# Patient Record
Sex: Male | Born: 1965 | Race: Black or African American | Hispanic: No | Marital: Married | Smoking: Never smoker
Health system: Southern US, Community
[De-identification: ages and names within clinical notes are randomized; demographics above are authoritative.]

## PROBLEM LIST (undated history)

## (undated) DIAGNOSIS — C189 Malignant neoplasm of colon, unspecified: Secondary | ICD-10-CM

## (undated) DIAGNOSIS — G4730 Sleep apnea, unspecified: Secondary | ICD-10-CM

## (undated) DIAGNOSIS — E785 Hyperlipidemia, unspecified: Secondary | ICD-10-CM

## (undated) DIAGNOSIS — Z972 Presence of dental prosthetic device (complete) (partial): Secondary | ICD-10-CM

## (undated) DIAGNOSIS — I10 Essential (primary) hypertension: Secondary | ICD-10-CM

## (undated) DIAGNOSIS — N529 Male erectile dysfunction, unspecified: Secondary | ICD-10-CM

## (undated) HISTORY — PX: ABDOMINAL HERNIA REPAIR: SHX539

## (undated) HISTORY — DX: Malignant neoplasm of colon, unspecified: C18.9

## (undated) HISTORY — DX: Male erectile dysfunction, unspecified: N52.9

## (undated) HISTORY — DX: Sleep apnea, unspecified: G47.30

## (undated) HISTORY — DX: Hyperlipidemia, unspecified: E78.5

---

## 2006-04-11 ENCOUNTER — Ambulatory Visit: Payer: Self-pay | Admitting: Otolaryngology

## 2011-08-19 DIAGNOSIS — C189 Malignant neoplasm of colon, unspecified: Secondary | ICD-10-CM

## 2011-08-19 HISTORY — DX: Malignant neoplasm of colon, unspecified: C18.9

## 2012-02-16 HISTORY — PX: PARTIAL COLECTOMY: SHX5273

## 2012-02-26 ENCOUNTER — Ambulatory Visit: Payer: Self-pay | Admitting: Emergency Medicine

## 2012-02-26 LAB — CBC WITH DIFFERENTIAL/PLATELET
Basophil #: 0 10*3/uL (ref 0.0–0.1)
Eosinophil %: 5 %
HCT: 42.6 % (ref 40.0–52.0)
HGB: 14.1 g/dL (ref 13.0–18.0)
Lymphocyte #: 1.3 10*3/uL (ref 1.0–3.6)
Lymphocyte %: 27.7 %
Monocyte %: 11 %
Neutrophil %: 55.6 %
RDW: 13.6 % (ref 11.5–14.5)
WBC: 4.7 10*3/uL (ref 3.8–10.6)

## 2012-03-11 ENCOUNTER — Inpatient Hospital Stay: Payer: Self-pay | Admitting: Emergency Medicine

## 2012-03-12 LAB — CBC WITH DIFFERENTIAL/PLATELET
Basophil %: 0.2 %
Eosinophil %: 0.1 %
MCH: 27.7 pg (ref 26.0–34.0)
MCHC: 32.8 g/dL (ref 32.0–36.0)
MCV: 85 fL (ref 80–100)
Platelet: 236 10*3/uL (ref 150–440)
RDW: 13.4 % (ref 11.5–14.5)

## 2012-03-13 LAB — CBC WITH DIFFERENTIAL/PLATELET
Basophil #: 0 10*3/uL (ref 0.0–0.1)
Eosinophil #: 0 10*3/uL (ref 0.0–0.7)
HGB: 10.3 g/dL — ABNORMAL LOW (ref 13.0–18.0)
Lymphocyte %: 22.7 %
MCHC: 33.8 g/dL (ref 32.0–36.0)
Neutrophil %: 66.6 %
Platelet: 260 10*3/uL (ref 150–440)
RDW: 13.4 % (ref 11.5–14.5)

## 2012-03-15 LAB — HEMOGLOBIN: HGB: 9 g/dL — ABNORMAL LOW (ref 13.0–18.0)

## 2012-03-29 ENCOUNTER — Ambulatory Visit: Payer: Self-pay | Admitting: Oncology

## 2012-04-18 ENCOUNTER — Ambulatory Visit: Payer: Self-pay | Admitting: Oncology

## 2012-09-20 ENCOUNTER — Telehealth: Payer: Self-pay | Admitting: Genetic Counselor

## 2012-09-20 NOTE — Telephone Encounter (Signed)
PT called to schedule a genetic appt 03/24 @ 10 w/Karen Lowell Guitar.  Welcome packet mailed.

## 2012-10-16 ENCOUNTER — Ambulatory Visit: Payer: Self-pay | Admitting: Oncology

## 2012-11-08 ENCOUNTER — Other Ambulatory Visit: Payer: BC Managed Care – PPO | Admitting: Lab

## 2012-11-08 ENCOUNTER — Ambulatory Visit (HOSPITAL_BASED_OUTPATIENT_CLINIC_OR_DEPARTMENT_OTHER): Payer: BC Managed Care – PPO | Admitting: Genetic Counselor

## 2012-11-08 DIAGNOSIS — C189 Malignant neoplasm of colon, unspecified: Secondary | ICD-10-CM

## 2012-11-08 DIAGNOSIS — IMO0002 Reserved for concepts with insufficient information to code with codable children: Secondary | ICD-10-CM

## 2012-11-08 DIAGNOSIS — Z803 Family history of malignant neoplasm of breast: Secondary | ICD-10-CM

## 2012-11-08 DIAGNOSIS — Z800 Family history of malignant neoplasm of digestive organs: Secondary | ICD-10-CM

## 2012-11-09 ENCOUNTER — Encounter: Payer: Self-pay | Admitting: Genetic Counselor

## 2012-11-09 NOTE — Progress Notes (Signed)
Dr.  Gretta Began requested a consultation for genetic counseling and risk assessment for Patrick West, a 47 y.o. male, for discussion of his personal history of colon cancer and family history of colon and breast cancer. He presents to clinic today to discuss the possibility of a genetic predisposition to cancer, and to further clarify his risks, as well as his family members' risks for cancer.   HISTORY OF PRESENT ILLNESS: In 2013, at the age of 27, Patrick West was diagnosed with colon cancer. This was treated with surgery in July 2013.Marland Kitchen    Past Medical History  Diagnosis Date  . Colon cancer     History reviewed. No pertinent past surgical history.  History  Substance Use Topics  . Smoking status: Not on file  . Smokeless tobacco: Not on file  . Alcohol Use: Not on file    PERSONAL RISK ASSESSMENT FACTORS: Colonoscopy: colon polyps and colon cancer Non-smoker   FAMILY HISTORY:  We obtained a detailed, 4-generation family history.  Significant diagnoses are listed below: Family History  Problem Relation Age of Onset  . Breast cancer Sister 51  . Breast cancer Maternal Aunt     dx in her late 15s-30s  . Colon cancer Maternal Uncle     smoker  . Breast cancer Paternal Aunt     dx in her 84s  . Throat cancer Paternal Uncle     smoker  . Breast cancer Maternal Aunt     dx at age 47  . Colon cancer Maternal Aunt 29  . Breast cancer Paternal Aunt     dx around age 75  . Colon cancer Cousin     dx around age 60-17; paternal cousin  . Colon cancer Cousin 80    paternal cousin  The patient was diagnosed with colon cancer at age 22.  He has two sisters and one brother.  One sister was diagnosed with breast cancer at age 69.  She is undergoing genetic testing at [facility].  His parents are living at 69 and do not have a history of cancer.  His mother has two maternal half sisters, one full sister and five full brothers.  Her half sisters each had breast cancer, one  in her 66s-30s and the other at age 15.  Her full sister had colon cancer at age 52, and a brother had colon cancer at an unknown age.  The patient's father had 15 brothers and sisters.  Two sisters had breast cancer, one in her 31s and the other at age 33.  One brother who was a smoker had throat cancer.    Patient's maternal ancestors are of African American descent, and paternal ancestors are of African American descent. There is no reported Ashkenazi Jewish ancestry. There is no  known consanguinity.  GENETIC COUNSELING RISK ASSESSMENT, DISCUSSION, AND SUGGESTED FOLLOW UP: We reviewed the natural history and genetic etiology of sporadic, familial and hereditary cancer syndromes.  About 10% of colon cancer is hereditary.  Of this, about half is the result of either Lynch syndrome of Familial Adenomatous Polyposis (FAP).   About 5-10% of breast cancer is hereditary.  Of this, about 85% is the result of a BRCA1 or BRCA2 mutation.  We reviewed the red flags of hereditary cancer syndromes and the dominant inheritance patterns.  If the Lynch syndrome, FAP and BRCA testing is negative, we discussed that we could be testing for the wrong gene.  We discussed gene panels, and that several cancer genes  that are associated with different cancers can be tested at the same time.  Because of the different types of cancer that are in the patient's family, we will consider one of the panel tests.   The patient's personal history of colon cancer and family history of colon and breast cancer is suggestive of the following possible diagnosis: hereditary cancer syndrome  We discussed that identification of a hereditary cancer syndrome may help his care providers tailor the patients medical management. If a mutation indicating a hereditary cancer syndrome is detected in this case, the [employer] recommendations would include increased cancer surveillance and possible prophylactic surgery. If a  mutation is detected, the patient will be referred back to the referring provider and to any additional appropriate care providers to discuss the relevant options.   If a mutation is not found in the patient, this will decrease the likelihood of a hereditary cancer syndrome as the explanation for his colon cancer. Cancer surveillance options would be discussed for the patient according to the appropriate standard National Comprehensive Cancer Network and American Cancer Society guidelines, with consideration of their personal and family history risk factors. In this case, the patient will be referred back to their care providers for discussions of management.   In order to estimate his chance of having a MMR mutation, we used statistical models (Premm 1,2,6) and laboratory data that take into account his personal medical history, family history and ancestry.  Because each model is different, there can be a lot of variability in the risks they give.  Therefore, these numbers must be considered a rough range and not a precise risk of having a MMR mutation.  These models estimate that she has approximately a 33% chance of having a mutation. Based on this assessment of her family and personal history, genetic testing is recommended.  After considering the risks, benefits, and limitations, the patient provided informed consent for  the following  testing: Comprehensive Cancer Panel through [facility].   Per the patient's request, we will contact him by telephone to discuss these results. A follow up genetic counseling visit will be scheduled if indicated.  The patient was seen for a total of 60 minutes, greater than 50% of which was spent face-to-face counseling.  This plan is being carried out per Dr. Jovita Gamma recommendations.  This note will also be sent to the referring provider via the electronic medical record. The patient will be supplied with a summary of this genetic counseling discussion as well as  educational information on the discussed hereditary cancer syndromes following the conclusion of their visit.   Patient was discussed with Dr. Drue Second.   _______________________________________________________________________ For Office Staff:  Number of people involved in session: 1 Was an Intern/ student involved with case: yes }

## 2012-11-10 LAB — CBC CANCER CENTER
Basophil %: 0.6 %
Eosinophil %: 3.1 %
HCT: 45.8 % (ref 40.0–52.0)
HGB: 15.4 g/dL (ref 13.0–18.0)
Lymphocyte %: 28.3 %
MCHC: 33.6 g/dL (ref 32.0–36.0)
Monocyte #: 0.5 x10 3/mm (ref 0.2–1.0)
Neutrophil #: 3.3 x10 3/mm (ref 1.4–6.5)
Platelet: 243 x10 3/mm (ref 150–440)
WBC: 5.5 x10 3/mm (ref 3.8–10.6)

## 2012-11-12 LAB — CEA: CEA: 2.2 ng/mL (ref 0.0–4.7)

## 2012-11-16 ENCOUNTER — Ambulatory Visit: Payer: Self-pay | Admitting: Oncology

## 2012-12-07 ENCOUNTER — Telehealth: Payer: Self-pay | Admitting: Genetic Counselor

## 2012-12-07 NOTE — Telephone Encounter (Signed)
Left message that I was calling from [facility] and I had some test results.  I was told that he should be back around 4 PM.  I left my phone number for him to call back.

## 2012-12-16 ENCOUNTER — Telehealth: Payer: Self-pay | Admitting: Genetic Counselor

## 2012-12-16 NOTE — Telephone Encounter (Signed)
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  Patient does not want to pursue family studies.

## 2012-12-21 ENCOUNTER — Encounter: Payer: Self-pay | Admitting: Genetic Counselor

## 2014-03-11 ENCOUNTER — Emergency Department: Payer: Self-pay | Admitting: Emergency Medicine

## 2014-03-11 LAB — URINALYSIS, COMPLETE
Bacteria: NONE SEEN
Bilirubin,UR: NEGATIVE
GLUCOSE, UR: NEGATIVE mg/dL (ref 0–75)
Ketone: NEGATIVE
Leukocyte Esterase: NEGATIVE
Nitrite: NEGATIVE
PROTEIN: NEGATIVE
Ph: 6 (ref 4.5–8.0)
RBC,UR: <1 /HPF (ref 0–5)
SPECIFIC GRAVITY: 1.011 (ref 1.003–1.030)
SQUAMOUS EPITHELIAL: NONE SEEN
WBC UR: 2 /HPF (ref 0–5)

## 2014-03-11 LAB — COMPREHENSIVE METABOLIC PANEL
Albumin: 3.4 g/dL (ref 3.4–5.0)
Alkaline Phosphatase: 44 U/L — ABNORMAL LOW
Anion Gap: 6 — ABNORMAL LOW (ref 7–16)
BUN: 7 mg/dL (ref 7–18)
Bilirubin,Total: 0.5 mg/dL (ref 0.2–1.0)
CHLORIDE: 101 mmol/L (ref 98–107)
Calcium, Total: 8.7 mg/dL (ref 8.5–10.1)
Co2: 28 mmol/L (ref 21–32)
Creatinine: 1.06 mg/dL (ref 0.60–1.30)
EGFR (Non-African Amer.): >60
GLUCOSE: 91 mg/dL (ref 65–99)
Osmolality: 268 (ref 275–301)
POTASSIUM: 3.8 mmol/L (ref 3.5–5.1)
SGOT(AST): 33 U/L (ref 15–37)
SGPT (ALT): 39 U/L
Sodium: 135 mmol/L — ABNORMAL LOW (ref 136–145)
TOTAL PROTEIN: 8.3 g/dL — AB (ref 6.4–8.2)

## 2014-03-11 LAB — TROPONIN I

## 2014-03-11 LAB — CBC WITH DIFFERENTIAL/PLATELET
BASOS ABS: 0 10*3/uL (ref 0.0–0.1)
BASOS PCT: 0.5 %
Eosinophil #: 0.1 10*3/uL (ref 0.0–0.7)
Eosinophil %: 1.2 %
HCT: 45.4 % (ref 40.0–52.0)
HGB: 14.8 g/dL (ref 13.0–18.0)
Lymphocyte #: 1.5 10*3/uL (ref 1.0–3.6)
Lymphocyte %: 20.2 %
MCH: 28.5 pg (ref 26.0–34.0)
MCHC: 32.6 g/dL (ref 32.0–36.0)
MCV: 87 fL (ref 80–100)
MONO ABS: 0.9 x10 3/mm (ref 0.2–1.0)
Monocyte %: 11.5 %
NEUTROS ABS: 5.1 10*3/uL (ref 1.4–6.5)
NEUTROS PCT: 66.6 %
Platelet: 270 10*3/uL (ref 150–440)
RBC: 5.19 10*6/uL (ref 4.40–5.90)
RDW: 13 % (ref 11.5–14.5)
WBC: 7.7 10*3/uL (ref 3.8–10.6)

## 2014-03-11 LAB — LIPASE, BLOOD: LIPASE: 89 U/L (ref 73–393)

## 2014-04-17 ENCOUNTER — Ambulatory Visit: Payer: Self-pay | Admitting: Gastroenterology

## 2014-04-19 LAB — PATHOLOGY REPORT

## 2014-05-16 ENCOUNTER — Telehealth: Payer: Self-pay | Admitting: Genetic Counselor

## 2014-05-16 NOTE — Telephone Encounter (Signed)
LM on VM asking that he call back in regards to updated information from his visit in March.

## 2014-05-22 ENCOUNTER — Telehealth: Payer: Self-pay | Admitting: Genetic Counselor

## 2014-05-22 NOTE — Telephone Encounter (Signed)
LM on VM that we had updated information on his testing performed last fall and to please call back.

## 2014-05-26 ENCOUNTER — Telehealth: Payer: Self-pay | Admitting: Genetic Counselor

## 2014-05-26 NOTE — Telephone Encounter (Signed)
LM on VM.  Left a generic message as there is not a voice mail message.  Left CB number.

## 2014-12-05 NOTE — Op Note (Signed)
PATIENT NAME:  Patrick, West MR#:  [id number] DATE OF BIRTH:  [birth year]  DATE OF PROCEDURE:  03/11/2012  PREOPERATIVE DIAGNOSIS: Tumor in the sigmoid colon.   POSTOPERATIVE DIAGNOSIS: Tumor in the sigmoid colon.   PROCEDURE: Sigmoid colon resection.  SURGEON:  Abhay Godbolt S. Raudel Bazen, MD  INDICATIONS: This patient was seen by me in consultation because patient has a very large polyp in the sigmoid colon which could not be removed with a sigmoidoscopy and the rest of the colon was normal.   DESCRIPTION OF PROCEDURE: Patient was then brought to surgery and I explained to them all the surgical complications including leakage, bleeding, reoperation, etc. Patient was then brought to surgery and preoperatively Invanz was given and also Foley catheter was inserted.   Under general anesthesia, the abdomen was then prepped and draped. Low midline incision was made. After cutting skin and subcutaneous tissue the fascia was cut. The abdomen was then entered. After entering the abdomen, the small bowel was then packed upwards to the left upper quadrant as well as right upper quadrant. The tumor which was in the sigmoid colon I could feel it. It also had a lot of [place] ink on it. Dissection was done over the line of Toldt to bring the sigmoid colon up into the wound. After bringing up quite a bit of sigmoid colon we dissected the proximal as well as distal portion with the GIA. The omentum was then clamped and cut with 0 Vicryl ties and then suture was then performed with GIA and TA-60 and reinforcement of suture line was then performed with of 4-0 silk sutures. In the surgery Dr. Burt Knack also helped me. The wound was then cleaned out very nicely, washed out. The abdomen was then washed out and made sure there was no bleeding in the operative site and after this was made sure, made sure all the sponge count was correct then the abdomen was then closed peritoneum with running Vicryl,  fascia with interrupted Vicryl,  skin with skin clips.       Patient tolerated procedure well, sent to recovery room in satisfactory condition.   ____________________________ Welford Roche Phylis Bougie, MD msh:cms D: 03/11/2012 11:34:35 ET T: 03/11/2012 12:36:32 ET JOB#: [id number]  cc: Lizzy Hamre S. Phylis Bougie, MD, <Dictator> Jill Side, MD Sharene Butters MD ELECTRONICALLY SIGNED 03/16/2012 11:50

## 2016-03-07 IMAGING — CT CT STONE STUDY
2 of 4 series · 16 of 46 positions shown, 18 images · non-contrast
Comparison: None.

CLINICAL DATA: Abdomen and flank pain; microscopic hematuria

EXAM:
CT ABDOMEN AND PELVIS WITHOUT CONTRAST
TECHNIQUE: Multidetector CT imaging of the abdomen and pelvis was performed
following the standard protocol without oral or intravenous contrast
material administration.

[Series 2: stone standard full · axial · 0.77mm/px · z∈[-1155,-695]mm · 13 of 102 slices shown, 15 images]
[im 5/102  soft-tissue]
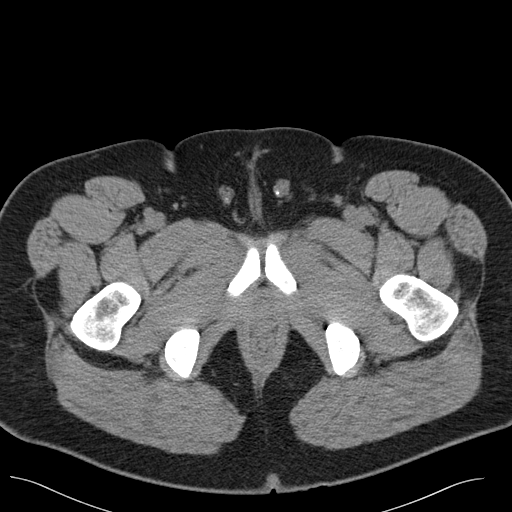
[im 5/102  bone]
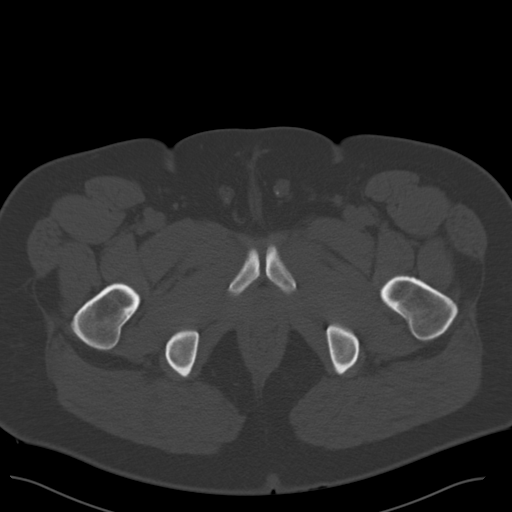
[im 13/102  soft-tissue]
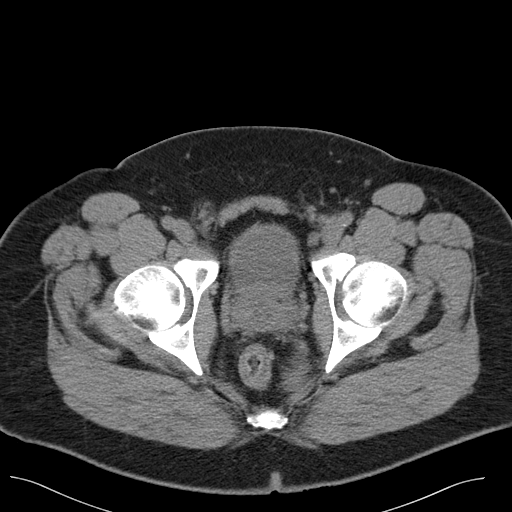
[im 22/102  soft-tissue]
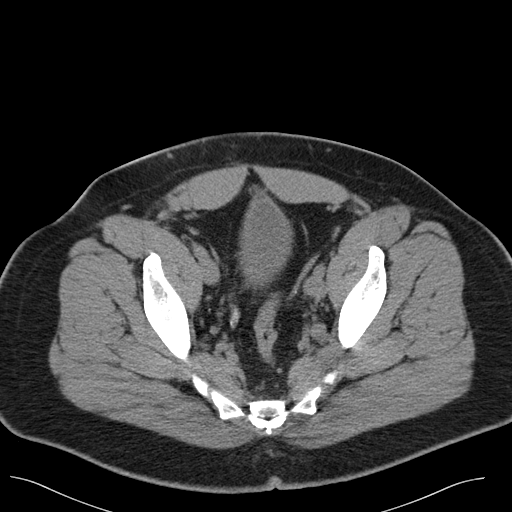
[im 30/102  soft-tissue]
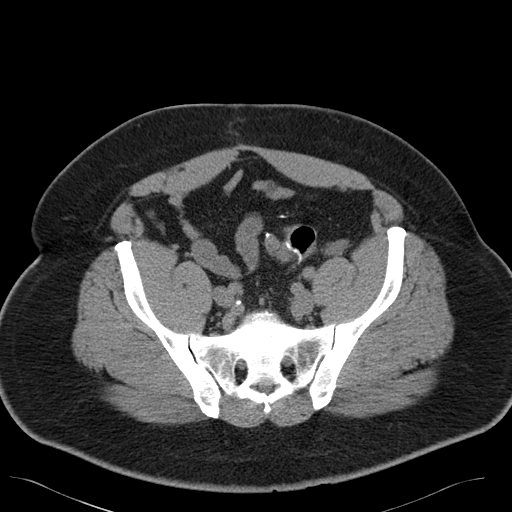
[im 34/102  soft-tissue]
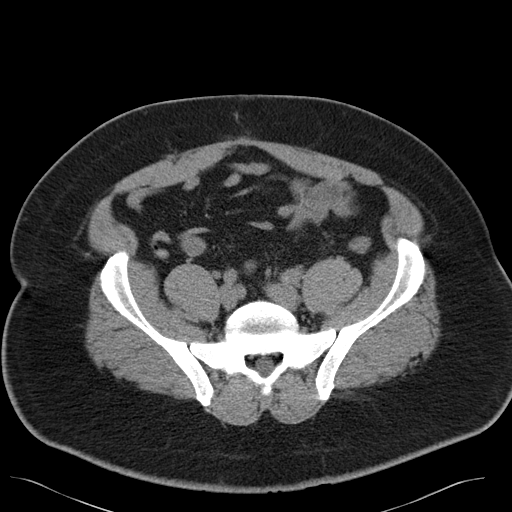
[im 43/102  soft-tissue]
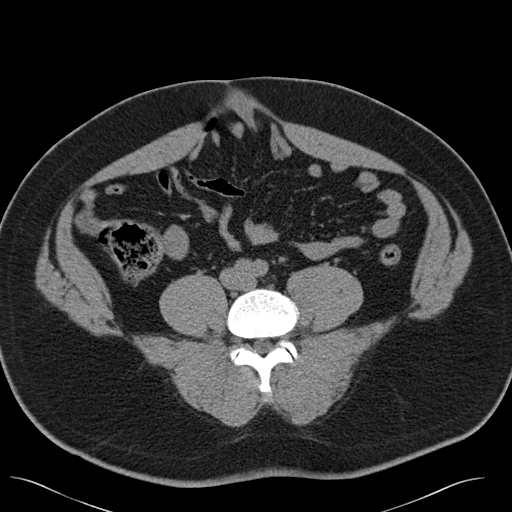
[im 51/102  soft-tissue]
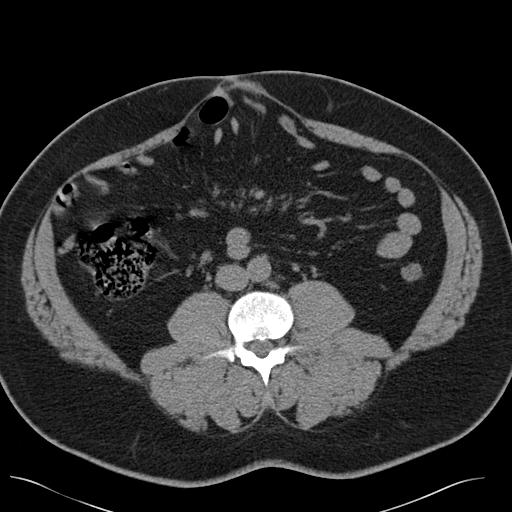
[im 59/102  soft-tissue]
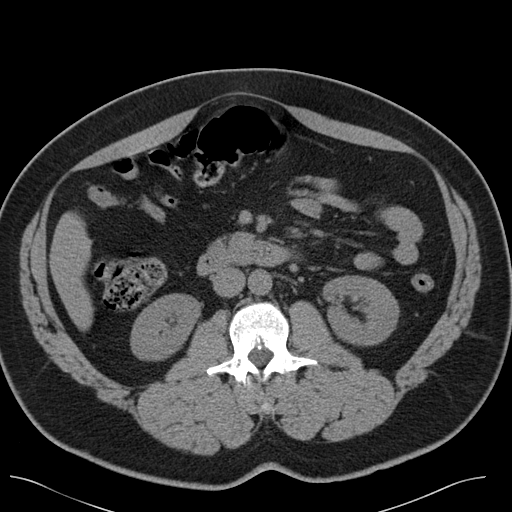
[im 68/102  soft-tissue]
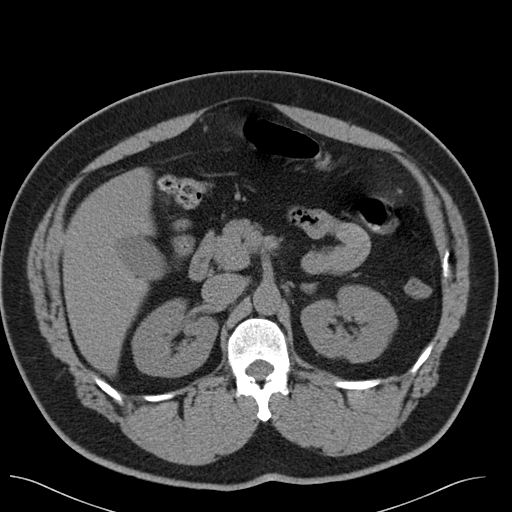
[im 68/102  bone]
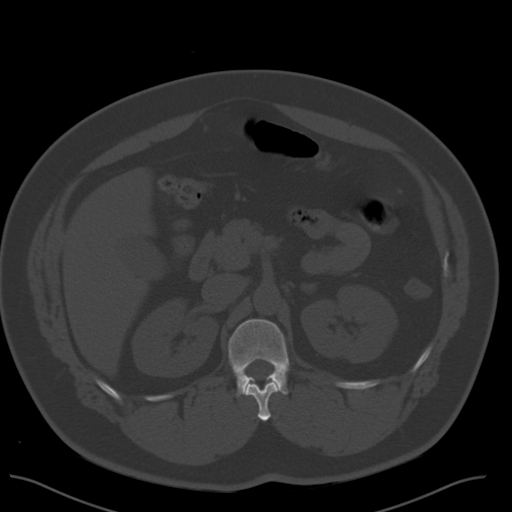
[im 72/102  soft-tissue]
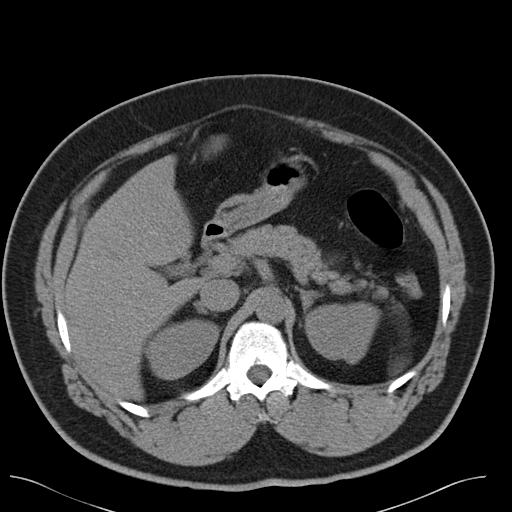
[im 80/102  soft-tissue]
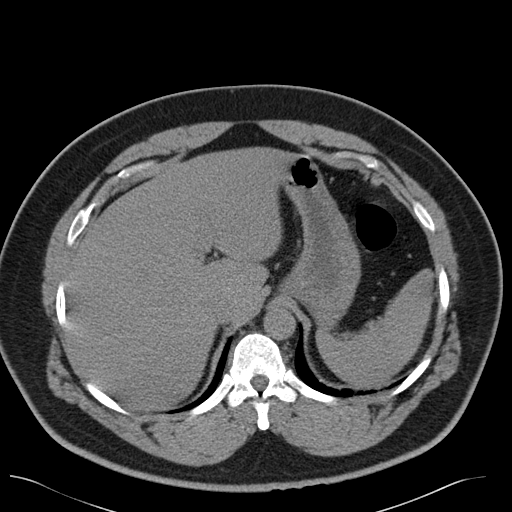
[im 89/102  soft-tissue]
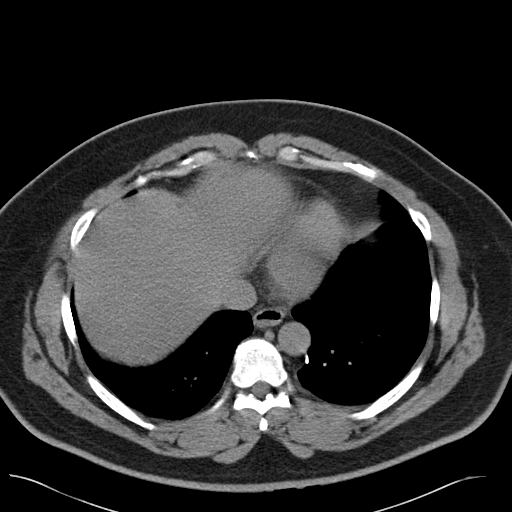
[im 97/102  soft-tissue]
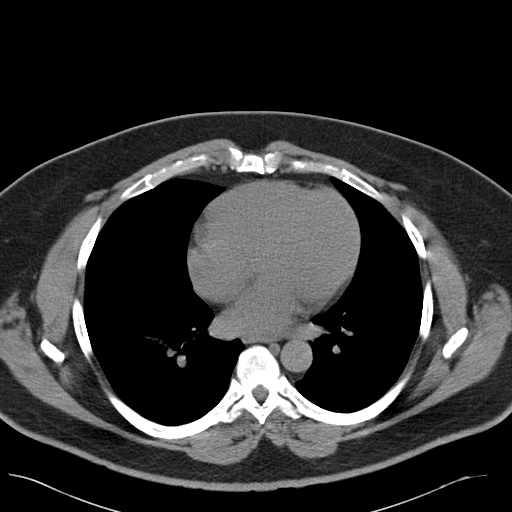

[Series 5: cor stone standard full · coronal · 0.85mm/px · 3 of 176 slices shown]
[im 59/176  soft-tissue]
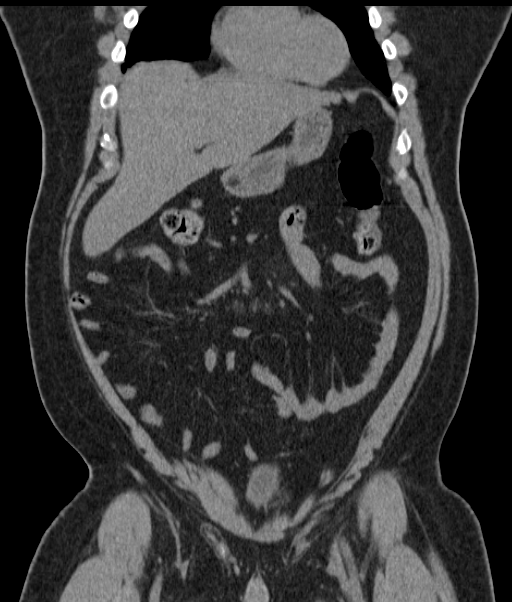
[im 78/176  soft-tissue]
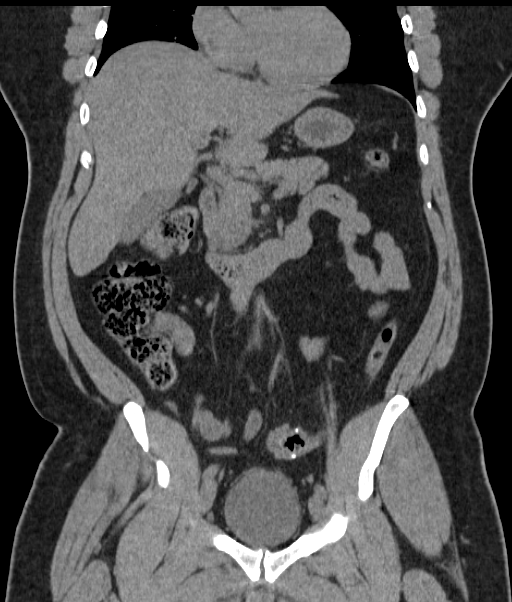
[im 98/176  soft-tissue]
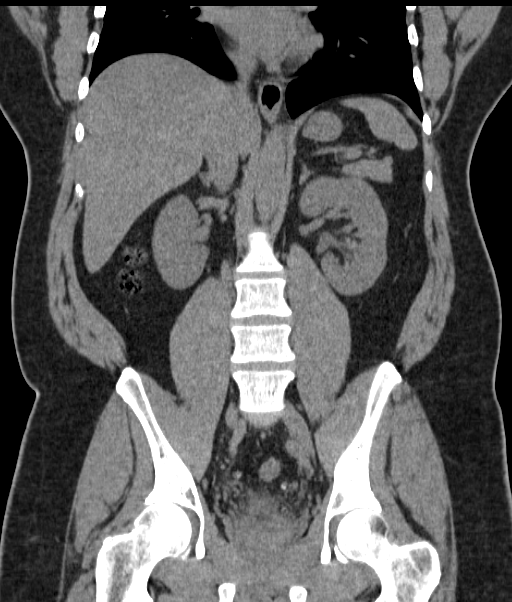

[16 of 46 positions shown; findings below may reference images not displayed]

FINDINGS: There is mild scarring in the anterior left lung base. Lung bases
are otherwise clear.

Liver is prominent, measuring 20.9 cm in length. No focal liver
lesions are identified. Gallbladder wall is not thickened. There is
no biliary duct dilatation.

Spleen, pancreas, and adrenals appear normal.

Kidneys bilaterally show no mass, calculus, or hydronephrosis on
either side. There is no ureteral calculus or ureterectasis on
either side.

In the pelvis, the urinary bladder is midline with normal wall
thickness. There are calcifications in the seminal vesicle region.
There is no pelvic mass or fluid collection. There is evidence of
previous surgery in the region of the sigmoid colon with patent
anastomosis. There is a diverticulum near the anastomosis which does
not appear inflamed. Appendix appears normal.

There is a small ventral hernia which contains bowel but no bowel
compromise.

There is no bowel obstruction.  No free air or portal venous air.

There is no ascites, adenopathy, or abscess in the abdomen or
pelvis. There is atherosclerotic change in the aorta but no
aneurysm. There are no blastic or lytic bone lesions.
IMPRESSION: No renal or ureteral calculus. No hydronephrosis. Urinary bladder
wall does not appear thickened. A cause for flank pain and
microscopic hematuria has not been established with this study.

Calcification and both seminal vesicles may be secondary to chronic
inflammation.

Postoperative change in the sigmoid colon with patent anastomosis. A
diverticulum is seen focally in the area of prior surgery without
appreciable inflammation in this area.

There is a small ventral hernia which contains bowel but no bowel
compromise.

Liver is prominent but otherwise appears unremarkable on this
noncontrast enhanced study.

No bowel obstruction.  No abscess.  Appendix appears normal.

## 2016-06-20 ENCOUNTER — Telehealth: Payer: Self-pay | Admitting: Unknown Physician Specialty

## 2016-06-20 DIAGNOSIS — Z0000 Encounter for general adult medical examination without abnormal findings: Secondary | ICD-10-CM

## 2016-06-20 NOTE — Telephone Encounter (Signed)
Routing to provider. Patient has appointment 06/24/16.

## 2016-06-20 NOTE — Telephone Encounter (Signed)
Pt would like a referral for a colonoscopy.

## 2016-06-23 ENCOUNTER — Telehealth: Payer: Self-pay

## 2016-06-23 ENCOUNTER — Other Ambulatory Visit: Payer: Self-pay

## 2016-06-23 NOTE — Telephone Encounter (Signed)
Hx of personal polyps Z86.010 07/07/2016 [facility] Dr. Lannie Fields Pre cert is not required

## 2016-06-23 NOTE — Telephone Encounter (Signed)
Gastroenterology Pre-Procedure Review  Request Date: 07/07/2016 Requesting Physician:   PATIENT REVIEW QUESTIONS: The patient responded to the following health history questions as indicated:    1. Are you having any GI issues? yes (abdominal pain ) 2. Do you have a personal history of Polyps? yes (cancerous) 3. Do you have a family history of Colon Cancer or Polyps? yes (Sister colon cancer, Paternal & Maternal uncles anc cousins Colon cancer) 10. Diabetes Mellitus? no 5. Joint replacements in the past 12 months?no 6. Major health problems in the past 3 months?no 7. Any artificial heart valves, MVP, or defibrillator?no    MEDICATIONS & ALLERGIES:    Patient reports the following regarding taking any anticoagulation/antiplatelet therapy:   Plavix, Coumadin, Eliquis, Xarelto, Lovenox, Pradaxa, Brilinta, or Effient? no Aspirin? no  Patient confirms/reports the following medications:  No current outpatient prescriptions on file.   No current facility-administered medications for this visit.     Patient confirms/reports the following allergies:  No Known Allergies  No orders of the defined types were placed in this encounter.   AUTHORIZATION INFORMATION Primary Insurance: 1D#: Group #:  Secondary Insurance: 1D#: Group #:  SCHEDULE INFORMATION: Date: 07/07/2016 Time: Location: [facility]

## 2016-06-24 ENCOUNTER — Encounter: Payer: Self-pay | Admitting: Unknown Physician Specialty

## 2016-06-24 ENCOUNTER — Ambulatory Visit (INDEPENDENT_AMBULATORY_CARE_PROVIDER_SITE_OTHER): Payer: BLUE CROSS/BLUE SHIELD | Admitting: Unknown Physician Specialty

## 2016-06-24 DIAGNOSIS — G4733 Obstructive sleep apnea (adult) (pediatric): Secondary | ICD-10-CM

## 2016-06-24 DIAGNOSIS — G4730 Sleep apnea, unspecified: Secondary | ICD-10-CM | POA: Insufficient documentation

## 2016-06-24 DIAGNOSIS — M722 Plantar fascial fibromatosis: Secondary | ICD-10-CM | POA: Diagnosis not present

## 2016-06-24 DIAGNOSIS — C189 Malignant neoplasm of colon, unspecified: Secondary | ICD-10-CM | POA: Diagnosis not present

## 2016-06-24 NOTE — Assessment & Plan Note (Signed)
Exercises given and take Magnesium

## 2016-06-24 NOTE — Assessment & Plan Note (Signed)
Has f/u with Dr. Allen Norris

## 2016-06-24 NOTE — Progress Notes (Signed)
BP 139/85 (BP Location: Left Arm, Patient Position: Sitting, Cuff Size: Large)   Pulse 72   Temp 98.4 F (36.9 C)   Ht 5' 10.6" (1.793 m) Comment: pt had boots on  Wt 285 lb (129.3 kg) Comment: pt had boots on  SpO2 98%   BMI 40.20 kg/m    Subjective:    Patient ID: Patrick West, male    DOB: 1965/12/25, 50 y.o.   MRN: [id number]  HPI: Patrick West is a 50 y.o. male  Chief Complaint  Patient presents with  . Foot Pain    pt states his left foot has been hurting for about a month. states it mainly hurts when getting out of bed and after sitting for periods of time.   . Sleep Apnea    pt states he thinks he may have sleep apnea    Sleep apnea Wife says he snores a lot and stops breathing at night.  He falls asleep whenever he is still.  Refused a sleep study previously but ready to get one.  He wakes up with a headache and does not feel rested.    Foot pain Pt states he is having left foot foot pain.  States it is worse when he first gets up after sitting for a period of time.  He went to a nurse at work and was told to practice ROM and said it was plantar fasciitis.    Relevant past medical, surgical, family and social history reviewed and updated as indicated. Interim medical history since our last visit reviewed. Allergies and medications reviewed and updated.  Review of Systems  Per HPI unless specifically indicated above     Objective:    BP 139/85 (BP Location: Left Arm, Patient Position: Sitting, Cuff Size: Large)   Pulse 72   Temp 98.4 F (36.9 C)   Ht 5' 10.6" (1.793 m) Comment: pt had boots on  Wt 285 lb (129.3 kg) Comment: pt had boots on  SpO2 98%   BMI 40.20 kg/m   Wt Readings from Last 3 Encounters:  06/24/16 285 lb (129.3 kg)    Physical Exam  Constitutional: He is oriented to person, place, and time. He appears well-developed and well-nourished. No distress.  HENT:  Head: Normocephalic and atraumatic.  Eyes: Conjunctivae and lids are  normal. Right eye exhibits no discharge. Left eye exhibits no discharge. No scleral icterus.  Neck: Normal range of motion. Neck supple. No JVD present. Carotid bruit is not present.  Cardiovascular: Normal rate, regular rhythm and normal heart sounds.   Pulmonary/Chest: Effort normal and breath sounds normal. No respiratory distress.  Abdominal: Normal appearance. There is no splenomegaly or hepatomegaly.  Musculoskeletal: Normal range of motion.  Neurological: He is alert and oriented to person, place, and time.  Skin: Skin is warm, dry and intact. No rash noted. No pallor.  Psychiatric: He has a normal mood and affect. His behavior is normal. Judgment and thought content normal.   Assessment & Plan:   Problem List Items Addressed This Visit      Unprioritized   Colon cancer [facility])    Has f/u with Dr. Allen Norris      Plantar fasciitis    Exercises given and take Magnesium      Sleep apnea    Schedule sleep study      Relevant Orders   Ambulatory referral to Sleep Studies       Follow up plan: Return for physical.

## 2016-06-24 NOTE — Assessment & Plan Note (Signed)
Schedule sleep study

## 2016-06-30 ENCOUNTER — Encounter: Payer: Self-pay | Admitting: *Deleted

## 2016-07-04 NOTE — Discharge Instructions (Signed)

## 2016-07-07 ENCOUNTER — Ambulatory Visit: Payer: BLUE CROSS/BLUE SHIELD | Admitting: Anesthesiology

## 2016-07-07 ENCOUNTER — Encounter
Admission: RE | Disposition: A | Discharge status: Home / Self Care | Payer: Self-pay | Source: Ambulatory Visit | Attending: Gastroenterology

## 2016-07-07 ENCOUNTER — Ambulatory Visit
Admission: RE | Admit: 2016-07-07 | Discharge: 2016-07-07 | Disposition: A | Discharge status: Home / Self Care | Payer: BLUE CROSS/BLUE SHIELD | Source: Ambulatory Visit | Attending: Gastroenterology | Admitting: Gastroenterology

## 2016-07-07 DIAGNOSIS — E785 Hyperlipidemia, unspecified: Secondary | ICD-10-CM | POA: Diagnosis not present

## 2016-07-07 DIAGNOSIS — Z85038 Personal history of other malignant neoplasm of large intestine: Secondary | ICD-10-CM | POA: Diagnosis not present

## 2016-07-07 DIAGNOSIS — K641 Second degree hemorrhoids: Secondary | ICD-10-CM | POA: Insufficient documentation

## 2016-07-07 DIAGNOSIS — Z1211 Encounter for screening for malignant neoplasm of colon: Secondary | ICD-10-CM | POA: Diagnosis not present

## 2016-07-07 DIAGNOSIS — G4730 Sleep apnea, unspecified: Secondary | ICD-10-CM | POA: Diagnosis not present

## 2016-07-07 DIAGNOSIS — Z08 Encounter for follow-up examination after completed treatment for malignant neoplasm: Secondary | ICD-10-CM | POA: Insufficient documentation

## 2016-07-07 HISTORY — DX: Presence of dental prosthetic device (complete) (partial): Z97.2

## 2016-07-07 HISTORY — PX: COLONOSCOPY WITH PROPOFOL: SHX5780

## 2016-07-07 SURGERY — COLONOSCOPY WITH PROPOFOL
Anesthesia: Monitor Anesthesia Care | Wound class: Contaminated

## 2016-07-07 MED ORDER — LIDOCAINE HCL (CARDIAC) 20 MG/ML IV SOLN
INTRAVENOUS | Status: DC | PRN
  Administered 2016-07-07: 50 mg via INTRAVENOUS

## 2016-07-07 MED ORDER — PROPOFOL 10 MG/ML IV BOLUS
INTRAVENOUS | Status: DC | PRN
  Administered 2016-07-07 (×5): 50 mg via INTRAVENOUS

## 2016-07-07 MED ORDER — STERILE WATER FOR IRRIGATION IR SOLN
Status: DC | PRN
  Administered 2016-07-07: 08:00:00

## 2016-07-07 MED ORDER — LACTATED RINGERS IV SOLN
INTRAVENOUS | Status: DC
  Administered 2016-07-07: 08:00:00 via INTRAVENOUS

## 2016-07-07 MED ORDER — ACETAMINOPHEN 325 MG PO TABS: 325.0000 mg | ORAL_TABLET | ORAL | Status: DC | PRN

## 2016-07-07 MED ORDER — ACETAMINOPHEN 160 MG/5ML PO SOLN: 325.0000 mg | ORAL | Status: DC | PRN

## 2016-07-07 MED ORDER — SODIUM CHLORIDE 0.9 % IV SOLN: INTRAVENOUS | Status: DC

## 2016-07-07 SURGICAL SUPPLY — 23 items

## 2016-07-07 NOTE — Transfer of Care (Signed)
Immediate Anesthesia Transfer of Care Note  Patient: Patrick West  Procedure(s) Performed: Procedure(s) with comments: COLONOSCOPY WITH PROPOFOL (N/A) - sleep apnea  Patient Location: PACU  Anesthesia Type: MAC  Level of Consciousness: awake, alert  and patient cooperative  Airway and Oxygen Therapy: Patient Spontanous Breathing and Patient connected to supplemental oxygen  Post-op Assessment: Post-op Vital signs reviewed, Patient's Cardiovascular Status Stable, Respiratory Function Stable, Patent Airway and No signs of Nausea or vomiting  Post-op Vital Signs: Reviewed and stable  Complications: No apparent anesthesia complications

## 2016-07-07 NOTE — Anesthesia Preprocedure Evaluation (Signed)
Anesthesia Evaluation  Patient identified by MRN, date of birth, ID band Patient awake    Reviewed: Allergy & Precautions, H&P , NPO status , Patient's Chart, lab work & pertinent test results, reviewed documented beta blocker date and time   Airway Mallampati: II  TM Distance: >3 FB Neck ROM: full    Dental  (+) Partial Upper   Pulmonary sleep apnea ,  OSA not proven yet.  Sleep study pending. RA sat this morning was 94%   Pulmonary exam normal breath sounds clear to auscultation       Cardiovascular Exercise Tolerance: Good negative cardio ROS   Rhythm:regular Rate:Normal     Neuro/Psych negative neurological ROS  negative psych ROS   GI/Hepatic negative GI ROS, Neg liver ROS,   Endo/Other  negative endocrine ROS  Renal/GU negative Renal ROS  negative genitourinary   Musculoskeletal   Abdominal   Peds  Hematology negative hematology ROS (+)   Anesthesia Other Findings   Reproductive/Obstetrics negative OB ROS                             Anesthesia Physical Anesthesia Plan  ASA: II  Anesthesia Plan: MAC   Post-op Pain Management:    Induction:   Airway Management Planned:   Additional Equipment:   Intra-op Plan:   Post-operative Plan:   Informed Consent: I have reviewed the patients History and Physical, chart, labs and discussed the procedure including the risks, benefits and alternatives for the proposed anesthesia with the patient or authorized representative who has indicated his/her understanding and acceptance.   Dental Advisory Given  Plan Discussed with: CRNA and Anesthesiologist  Anesthesia Plan Comments:         Anesthesia Quick Evaluation

## 2016-07-07 NOTE — Op Note (Signed)
[facility] Patient Name: Patrick West Procedure Date: 07/07/2016 7:46 AM MRN: [id number] Account #: [id number] Date of Birth: Jan 13, 1966 Admit Type: Outpatient Age: 50 Room: [facility] OR ROOM 01 Gender: Male Note Status: Finalized Procedure:            Colonoscopy Indications:          High risk colon cancer surveillance: Personal history                        of colon cancer Providers:            Lucilla Lame MD, MD Referring MD:         Kathrine Haddock (Referring MD) Medicines:            Propofol per Anesthesia Complications:        No immediate complications. Procedure:            Pre-Anesthesia Assessment:                       - Prior to the procedure, a History and Physical was                        performed, and patient medications and allergies were                        reviewed. The patient's tolerance of previous                        anesthesia was also reviewed. The risks and benefits of                        the procedure and the sedation options and risks were                        discussed with the patient. All questions were                        answered, and informed consent was obtained. Prior                        Anticoagulants: The patient has taken no previous                        anticoagulant or antiplatelet agents. ASA Grade                        Assessment: II - A patient with mild systemic disease.                        After reviewing the risks and benefits, the patient was                        deemed in satisfactory condition to undergo the                        procedure.                       After obtaining informed consent, the colonoscope was  passed under direct vision. Throughout the procedure,                        the patient's blood pressure, pulse, and oxygen                        saturations were monitored continuously. The was                        introduced through  the anus and advanced to the the                        cecum, identified by appendiceal orifice and ileocecal                        valve. The colonoscopy was performed without                        difficulty. The patient tolerated the procedure well.                        The quality of the bowel preparation was excellent. Findings:      The perianal and digital rectal examinations were normal.      Non-bleeding internal hemorrhoids were found during retroflexion. The       hemorrhoids were Grade II (internal hemorrhoids that prolapse but reduce       spontaneously). Impression:           - Non-bleeding internal hemorrhoids.                       - No specimens collected. Recommendation:       - Discharge patient to home.                       - Resume previous diet.                       - Continue present medications. Procedure Code(s):    --- Professional ---                       [phone], Colonoscopy, flexible; diagnostic, including                        collection of specimen(s) by brushing or washing, when                        performed (separate procedure) Diagnosis Code(s):    --- Professional ---                       [id number], Personal history of other malignant neoplasm                        of large intestine CPT copyright 2016 American Medical Association. All rights reserved. The codes documented in this report are preliminary and upon coder review may  be revised to meet current compliance requirements. Lucilla Lame MD, MD 07/07/2016 8:21:46 AM This report has been signed electronically. Number of Addenda: 0 Note Initiated On: 07/07/2016 7:46 AM Scope Withdrawal Time: 0 hours 5 minutes 7 seconds  Total Procedure Duration: 0 hours 6 minutes 38  seconds       [facility]

## 2016-07-07 NOTE — H&P (Signed)
  Lucilla Lame, MD [facility] [address], [place], [place] [address] Phone: [phone] Fax : [phone]  Primary Care Physician:  No primary care provider on file. Primary Gastroenterologist:  Dr. Allen Norris  Pre-Procedure History & Physical: HPI:  Patrick West is a 50 y.o. male is here for an colonoscopy.   Past Medical History:  Diagnosis Date  . 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 ED (erectile dysfunction)   . Hyperlipidemia   . Sleep apnea    getting scheduled for sleep study  . Wears dentures    partial upper    Past Surgical History:  Procedure Laterality Date  . PARTIAL COLECTOMY  02/2012    Prior to Admission medications   Not on File    Allergies as of 06/23/2016  . (No Known Allergies)    Family History  Problem Relation Age of Onset  . Heart disease Mother   . Hypertension Mother   . Heart disease Father   . Hypertension Father   . Stroke Father   . Glaucoma Father   . Breast cancer Sister 26  . Breast cancer Maternal Aunt     dx in her late 94s-30s  . Colon cancer Maternal Uncle     smoker  . Breast cancer Paternal Aunt     dx in her 52s  . Throat cancer Paternal Uncle     smoker  . Breast cancer Maternal Aunt     dx at age 94  . Colon cancer Maternal Aunt 29  . Breast cancer Paternal Aunt     dx around age 56  . Colon cancer Cousin     dx around age 75-17; paternal cousin  . Colon cancer Cousin 27    paternal cousin    Social History   Social History  . Marital status: Married    Spouse name: N/A  . Number of children: N/A  . Years of education: N/A   Occupational History  . Not on file.   Social History Main Topics  . Smoking status: Never Smoker  . Smokeless tobacco: Never Used  . Alcohol use 1.8 oz/week    3 Cans of beer per week     Comment:    . Drug use: No  . Sexual activity: Not on file   Other Topics Concern  . Not on file   Social History Narrative  . No narrative on file    Review of  Systems: See HPI, otherwise negative ROS  Physical Exam: BP 126/84   Pulse 81   Temp 97.8 F (36.6 C) (Tympanic)   Resp 16   Ht 5\' 11"  (1.803 m)   Wt 280 lb (127 kg)   SpO2 93%   BMI 39.05 kg/m  General:   Alert,  pleasant and cooperative in NAD Head:  Normocephalic and atraumatic. Neck:  Supple; no masses or thyromegaly. Lungs:  Clear throughout to auscultation.    Heart:  Regular rate and rhythm. Abdomen:  Soft, nontender and nondistended. Normal bowel sounds, without guarding, and without rebound.   Neurologic:  Alert and  oriented x4;  grossly normal neurologically.  Impression/Plan: Patrick West is here for an colonoscopy to be performed for history of polyps  Risks, benefits, limitations, and alternatives regarding  colonoscopy have been reviewed with the patient.  Questions have been answered.  All parties agreeable.   Lucilla Lame, MD  07/07/2016, 7:56 AM

## 2016-07-07 NOTE — Anesthesia Postprocedure Evaluation (Signed)
Anesthesia Post Note  Patient: Patrick West  Procedure(s) Performed: Procedure(s) (LRB): COLONOSCOPY WITH PROPOFOL (N/A)  Patient location during evaluation: PACU Anesthesia Type: MAC Level of consciousness: awake and alert Pain management: pain level controlled Vital Signs Assessment: post-procedure vital signs reviewed and stable Respiratory status: spontaneous breathing, nonlabored ventilation, respiratory function stable and patient connected to nasal cannula oxygen Cardiovascular status: stable and blood pressure returned to baseline Anesthetic complications: no    Trecia Rogers

## 2016-07-07 NOTE — Anesthesia Procedure Notes (Signed)
Procedure Name: MAC Performed by: Joyanna Kleman Pre-anesthesia Checklist: Patient identified, Emergency Drugs available, Suction available, Timeout performed and Patient being monitored Patient Re-evaluated:Patient Re-evaluated prior to inductionOxygen Delivery Method: Nasal cannula Placement Confirmation: positive ETCO2     

## 2016-07-08 ENCOUNTER — Encounter: Payer: Self-pay | Admitting: Gastroenterology

## 2016-07-09 DIAGNOSIS — G4733 Obstructive sleep apnea (adult) (pediatric): Secondary | ICD-10-CM | POA: Diagnosis not present

## 2016-07-09 DIAGNOSIS — E669 Obesity, unspecified: Secondary | ICD-10-CM | POA: Diagnosis not present

## 2016-07-17 DIAGNOSIS — G4733 Obstructive sleep apnea (adult) (pediatric): Secondary | ICD-10-CM | POA: Diagnosis not present

## 2016-09-12 ENCOUNTER — Encounter: Payer: Self-pay | Admitting: Unknown Physician Specialty

## 2016-10-09 DIAGNOSIS — G4733 Obstructive sleep apnea (adult) (pediatric): Secondary | ICD-10-CM | POA: Diagnosis not present

## 2016-10-09 DIAGNOSIS — E669 Obesity, unspecified: Secondary | ICD-10-CM | POA: Diagnosis not present

## 2016-11-20 ENCOUNTER — Emergency Department: Payer: BLUE CROSS/BLUE SHIELD

## 2016-11-20 ENCOUNTER — Encounter: Payer: Self-pay | Admitting: Emergency Medicine

## 2016-11-20 DIAGNOSIS — S6991XA Unspecified injury of right wrist, hand and finger(s), initial encounter: Secondary | ICD-10-CM | POA: Diagnosis not present

## 2016-11-20 DIAGNOSIS — M79641 Pain in right hand: Secondary | ICD-10-CM | POA: Diagnosis not present

## 2016-11-20 DIAGNOSIS — Y999 Unspecified external cause status: Secondary | ICD-10-CM | POA: Insufficient documentation

## 2016-11-20 DIAGNOSIS — Y929 Unspecified place or not applicable: Secondary | ICD-10-CM | POA: Diagnosis not present

## 2016-11-20 DIAGNOSIS — Z85038 Personal history of other malignant neoplasm of large intestine: Secondary | ICD-10-CM | POA: Insufficient documentation

## 2016-11-20 DIAGNOSIS — S40011A Contusion of right shoulder, initial encounter: Secondary | ICD-10-CM | POA: Insufficient documentation

## 2016-11-20 DIAGNOSIS — W2201XA Walked into wall, initial encounter: Secondary | ICD-10-CM | POA: Diagnosis not present

## 2016-11-20 DIAGNOSIS — Y9302 Activity, running: Secondary | ICD-10-CM | POA: Insufficient documentation

## 2016-11-20 DIAGNOSIS — S4991XA Unspecified injury of right shoulder and upper arm, initial encounter: Secondary | ICD-10-CM | POA: Diagnosis not present

## 2016-11-20 DIAGNOSIS — S70312A Abrasion, left thigh, initial encounter: Secondary | ICD-10-CM | POA: Insufficient documentation

## 2016-11-20 DIAGNOSIS — M25511 Pain in right shoulder: Secondary | ICD-10-CM | POA: Diagnosis not present

## 2016-11-20 DIAGNOSIS — M79652 Pain in left thigh: Secondary | ICD-10-CM | POA: Diagnosis not present

## 2016-11-20 DIAGNOSIS — T148XXA Other injury of unspecified body region, initial encounter: Secondary | ICD-10-CM | POA: Diagnosis not present
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
## 2016-11-21 ENCOUNTER — Emergency Department
Admission: EM | Admit: 2016-11-21 | Discharge: 2016-11-21 | Disposition: A | Discharge status: Home / Self Care | Payer: BLUE CROSS/BLUE SHIELD | Attending: Emergency Medicine | Admitting: Emergency Medicine

## 2016-11-21 DIAGNOSIS — S40011A Contusion of right shoulder, initial encounter: Secondary | ICD-10-CM

## 2016-11-21 DIAGNOSIS — T148XXA Other injury of unspecified body region, initial encounter: Secondary | ICD-10-CM

## 2016-11-21 DIAGNOSIS — M25511 Pain in right shoulder: Secondary | ICD-10-CM

## 2016-11-21 MED ORDER — CYCLOBENZAPRINE HCL 10 MG PO TABS: 10.0000 mg | ORAL_TABLET | Freq: Three times a day (TID) | ORAL | 0 refills | Status: DC | PRN

## 2016-11-21 MED ORDER — OXYCODONE-ACETAMINOPHEN 5-325 MG PO TABS
1.0000 | ORAL_TABLET | Freq: Once | ORAL | Status: AC
  Administered 2016-11-21: 1 via ORAL
  Filled 2016-11-21: qty 1
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
## 2016-11-21 NOTE — ED Notes (Signed)
Reviewed d/c instructions, follow-up care, prescriptions, use of ice/elevation with patient. Pt verbalized understanding
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
## 2016-12-11 DIAGNOSIS — G4733 Obstructive sleep apnea (adult) (pediatric): Secondary | ICD-10-CM | POA: Diagnosis not present

## 2016-12-11 DIAGNOSIS — E669 Obesity, unspecified: Secondary | ICD-10-CM | POA: Diagnosis not present

## 2016-12-26 ENCOUNTER — Encounter: Payer: Self-pay | Admitting: Unknown Physician Specialty

## 2016-12-26 ENCOUNTER — Ambulatory Visit (INDEPENDENT_AMBULATORY_CARE_PROVIDER_SITE_OTHER): Payer: BLUE CROSS/BLUE SHIELD | Admitting: Unknown Physician Specialty

## 2016-12-26 DIAGNOSIS — G4700 Insomnia, unspecified: Secondary | ICD-10-CM | POA: Insufficient documentation

## 2016-12-26 DIAGNOSIS — F5101 Primary insomnia: Secondary | ICD-10-CM

## 2016-12-26 DIAGNOSIS — F322 Major depressive disorder, single episode, severe without psychotic features: Secondary | ICD-10-CM | POA: Insufficient documentation

## 2016-12-26 MED ORDER — LORAZEPAM 0.5 MG PO TABS: 0.5000 mg | ORAL_TABLET | Freq: Every evening | ORAL | 1 refills | Status: DC | PRN

## 2016-12-26 MED ORDER — CITALOPRAM HYDROBROMIDE 20 MG PO TABS: 20.0000 mg | ORAL_TABLET | Freq: Every day | ORAL | 1 refills | Status: DC

## 2016-12-26 NOTE — Assessment & Plan Note (Signed)
New problem.  Discussed Lorazepam for a bridge until Citalopram kicks in.

## 2016-12-26 NOTE — Progress Notes (Signed)
BP (!) 143/87 (BP Location: Left Arm, Patient Position: Sitting, Cuff Size: Large)   Pulse 92   Temp 98.2 F (36.8 C)   Ht 5\' 11"  (1.803 m)   Wt 285 lb (129.3 kg)   SpO2 95%   BMI 39.75 kg/m    Subjective:    Patient ID: Patrick West, male    DOB: April 17, 1966, 51 y.o.   MRN: [id number]  HPI: Patrick West is a 51 y.o. male  Chief Complaint  Patient presents with  . Sleeping Problem    Trouble falling asleep and staying asleep. Patient said he has a lot going on. Filling out PHQ9 form.   Pt is here with "a lot going on."  His mother had open surgery, daughter flunking out of school and thinks his wife had breast cancer.    CC is having trouble sleeping.  Problem has been going on for 2-3 weeks and having trouble staying awake at work.    Depression screen PHQ 2/9 12/26/2016  Decreased Interest 3  Down, Depressed, Hopeless 3  PHQ - 2 Score 6  Altered sleeping 3  Tired, decreased energy 3  Change in appetite 2  Feeling bad or failure about yourself  3  Trouble concentrating 2  Moving slowly or fidgety/restless 3  Suicidal thoughts 1  PHQ-9 Score 23   Family History  Problem Relation Age of Onset  . Heart disease Mother   . Hypertension Mother   . Heart disease Father   . Hypertension Father   . Stroke Father   . Glaucoma Father   . Breast cancer Sister 29  . Breast cancer Maternal Aunt        dx in her late 70s-30s  . Colon cancer Maternal Uncle        smoker  . Breast cancer Paternal Aunt        dx in her 45s  . Throat cancer Paternal Uncle        smoker  . Breast cancer Maternal Aunt        dx at age 67  . Colon cancer Maternal Aunt 29  . Breast cancer Paternal Aunt        dx around age 32  . Colon cancer Cousin        dx around age 11-17; paternal cousin  . Colon cancer Cousin 40       paternal cousin   Past Medical History:  Diagnosis Date  . 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 ED (erectile dysfunction)   . Hyperlipidemia   . Sleep  apnea    getting scheduled for sleep study  . Wears dentures    partial upper     Relevant past medical, surgical, family and social history reviewed and updated as indicated. Interim medical history since our last visit reviewed. Allergies and medications reviewed and updated.  Review of Systems  HENT: Negative.   Eyes: Negative.   Respiratory: Negative.   Cardiovascular: Negative.   Gastrointestinal: Negative.   Genitourinary: Negative.   Neurological: Negative.   Hematological: Negative.     Per HPI unless specifically indicated above     Objective:    BP (!) 143/87 (BP Location: Left Arm, Patient Position: Sitting, Cuff Size: Large)   Pulse 92   Temp 98.2 F (36.8 C)   Ht 5\' 11"  (1.803 m)   Wt 285 lb (129.3 kg)   SpO2 95%   BMI 39.75 kg/m   Wt Readings from Last  3 Encounters:  12/26/16 285 lb (129.3 kg)  11/20/16 275 lb (124.7 kg)  07/07/16 280 lb (127 kg)    Physical Exam  Constitutional: He is oriented to person, place, and time. He appears well-developed and well-nourished. No distress.  HENT:  Head: Normocephalic and atraumatic.  Eyes: Conjunctivae and lids are normal. Right eye exhibits no discharge. Left eye exhibits no discharge. No scleral icterus.  Cardiovascular: Normal rate.   Pulmonary/Chest: Effort normal.  Abdominal: Normal appearance. There is no splenomegaly or hepatomegaly.  Musculoskeletal: Normal range of motion.  Neurological: He is alert and oriented to person, place, and time.  Skin: Skin is intact. No rash noted. No pallor.  Psychiatric: He has a normal mood and affect. His behavior is normal. Judgment and thought content normal.      Assessment & Plan:   Problem List Items Addressed This Visit      Unprioritized   Depression, major, single episode, severe ([place])    New problem.  Prescription for Citalopram.  Rx for Lorazepam for night-time use prn.  Discussed temporary rx.  Encouraged employee assistance counseling.         Relevant Medications   citalopram (CELEXA) 20 MG tablet   LORazepam (ATIVAN) 0.5 MG tablet   Insomnia    New problem.  Discussed Lorazepam for a bridge until Citalopram kicks in.            Follow up plan: Return in about 4 weeks (around 01/23/2017).

## 2016-12-26 NOTE — Assessment & Plan Note (Addendum)
New problem.  Prescription for Citalopram.  Rx for Lorazepam for night-time use prn.  Discussed temporary rx.  Encouraged employee assistance counseling.

## 2017-01-27 ENCOUNTER — Ambulatory Visit: Payer: BLUE CROSS/BLUE SHIELD | Admitting: Unknown Physician Specialty

## 2017-02-24 DIAGNOSIS — M25521 Pain in right elbow: Secondary | ICD-10-CM | POA: Diagnosis not present

## 2017-02-24 DIAGNOSIS — M7701 Medial epicondylitis, right elbow: Secondary | ICD-10-CM | POA: Diagnosis not present

## 2017-02-24 DIAGNOSIS — S59901A Unspecified injury of right elbow, initial encounter: Secondary | ICD-10-CM | POA: Diagnosis not present

## 2017-06-01 ENCOUNTER — Encounter: Payer: Self-pay | Admitting: Unknown Physician Specialty

## 2017-06-01 ENCOUNTER — Ambulatory Visit (INDEPENDENT_AMBULATORY_CARE_PROVIDER_SITE_OTHER): Payer: BLUE CROSS/BLUE SHIELD | Admitting: Unknown Physician Specialty

## 2017-06-01 VITALS — BP 139/81 | HR 91 | Temp 97.8°F | Wt 293.2 lb

## 2017-06-01 DIAGNOSIS — G4733 Obstructive sleep apnea (adult) (pediatric): Secondary | ICD-10-CM | POA: Diagnosis not present

## 2017-06-01 DIAGNOSIS — R5383 Other fatigue: Secondary | ICD-10-CM | POA: Diagnosis not present

## 2017-06-01 MED ORDER — ARMODAFINIL 50 MG PO TABS: 100.0000 mg | ORAL_TABLET | Freq: Every day | ORAL | 2 refills | Status: DC

## 2017-06-01 NOTE — Assessment & Plan Note (Signed)
Incomplete response to CPAP.  Start Nuvigil 50 mg 2 daily.  Recheck 1 months

## 2017-06-01 NOTE — Progress Notes (Signed)
BP 139/81 (BP Location: Right Arm, Cuff Size: Large)   Pulse 91   Temp 97.8 F (36.6 C)   Wt 293 lb 3.2 oz (133 kg)   SpO2 95%   BMI 40.89 kg/m    Subjective:    Patient ID: Patrick West, male    DOB: [birth year], 51 y.o.   MRN: [id number]  HPI: Patrick West is a 51 y.o. male  Chief Complaint  Patient presents with  . Fatigue    pt states he has been feeling extremely tired lately, states he wants to try Nuvigil   Pt state he is using his CPAP nightly.  States he is sleeping about 7 hours on the machine.  States he continues to feel fatigue.  He still falls asleep in the middle of the day.  He tried Nuvigil which helped.    Depression Doing better, medication worked but lost to f/u.  No sure he needs it now.   Depression screen [facility] 2/9 06/01/2017 12/26/2016  Decreased Interest 0 3  Down, Depressed, Hopeless 0 3  PHQ - 2 Score 0 6  Altered sleeping 3 3  Tired, decreased energy 3 3  Change in appetite 2 2  Feeling bad or failure about yourself  0 3  Trouble concentrating 3 2  Moving slowly or fidgety/restless 0 3  Suicidal thoughts 0 1  PHQ-9 Score 11 23    Relevant past medical, surgical, family and social history reviewed and updated as indicated. Interim medical history since our last visit reviewed. Allergies and medications reviewed and updated.  Review of Systems  Per HPI unless specifically indicated above     Objective:    BP 139/81 (BP Location: Right Arm, Cuff Size: Large)   Pulse 91   Temp 97.8 F (36.6 C)   Wt 293 lb 3.2 oz (133 kg)   SpO2 95%   BMI 40.89 kg/m   Wt Readings from Last 3 Encounters:  06/01/17 293 lb 3.2 oz (133 kg)  12/26/16 285 lb (129.3 kg)  11/20/16 275 lb (124.7 kg)    Physical Exam  Constitutional: He is oriented to person, place, and time. He appears well-developed and well-nourished. No distress.  HENT:  Head: Normocephalic and atraumatic.  Eyes: Conjunctivae and lids are normal. Right eye exhibits no discharge.  Left eye exhibits no discharge. No scleral icterus.  Neck: Normal range of motion. Neck supple. No JVD present. Carotid bruit is not present.  Cardiovascular: Normal rate, regular rhythm and normal heart sounds.   Pulmonary/Chest: Effort normal and breath sounds normal. No respiratory distress.  Abdominal: Normal appearance. There is no splenomegaly or hepatomegaly.  Musculoskeletal: Normal range of motion.  Neurological: He is alert and oriented to person, place, and time.  Skin: Skin is warm, dry and intact. No rash noted. No pallor.  Psychiatric: He has a normal mood and affect. His behavior is normal. Judgment and thought content normal.      Assessment & Plan:   Problem List Items Addressed This Visit      Unprioritized   Sleep apnea    Incomplete response to CPAP.  Start Nuvigil 50 mg 2 daily.  Recheck 1 months       Other Visit Diagnoses    Fatigue, unspecified type    -  Primary   Relevant Orders   CBC with Differential/Platelet   Comprehensive metabolic panel   TSH       Follow up plan: Return in about 4 weeks (around 06/29/2017).

## 2017-06-02 ENCOUNTER — Telehealth: Payer: Self-pay

## 2017-06-02 ENCOUNTER — Other Ambulatory Visit: Payer: BLUE CROSS/BLUE SHIELD

## 2017-06-02 DIAGNOSIS — R5383 Other fatigue: Secondary | ICD-10-CM | POA: Diagnosis not present

## 2017-06-02 NOTE — Telephone Encounter (Signed)
Patient came into office for lab work. Patient states that his RX for Rockie Neighbours is not covered by insurance for twice daily. He states that his insurance covers one tablet daily. New RX written and signed by Dr. Wynetta Emery. Copy of RX placed in scan to be scanned into chart.

## 2017-06-02 NOTE — Telephone Encounter (Signed)
Armodafinil RX called into [facility] [place] due to fax machine being down.

## 2017-06-03 ENCOUNTER — Encounter: Payer: Self-pay | Admitting: Family Medicine

## 2017-06-03 LAB — COMPREHENSIVE METABOLIC PANEL
ALBUMIN: 4.2 g/dL (ref 3.5–5.5)
ALK PHOS: 48 IU/L (ref 39–117)
ALT: 44 IU/L (ref 0–44)
AST: 30 IU/L (ref 0–40)
Albumin/Globulin Ratio: 1.2 (ref 1.2–2.2)
BUN/Creatinine Ratio: 12 (ref 9–20)
BUN: 12 mg/dL (ref 6–24)
Bilirubin Total: 0.3 mg/dL (ref 0.0–1.2)
CO2: 23 mmol/L (ref 20–29)
CREATININE: 0.97 mg/dL (ref 0.76–1.27)
Calcium: 9.6 mg/dL (ref 8.7–10.2)
Chloride: 102 mmol/L (ref 96–106)
GFR calc Af Amer: 104 mL/min/{1.73_m2} (ref >59)
GFR calc non Af Amer: 90 mL/min/{1.73_m2} (ref >59)
GLUCOSE: 91 mg/dL (ref 65–99)
Globulin, Total: 3.4 g/dL (ref 1.5–4.5)
Potassium: 3.8 mmol/L (ref 3.5–5.2)
Sodium: 142 mmol/L (ref 134–144)
Total Protein: 7.6 g/dL (ref 6.0–8.5)

## 2017-06-03 LAB — CBC WITH DIFFERENTIAL/PLATELET
BASOS ABS: 0 10*3/uL (ref 0.0–0.2)
BASOS: 0 %
EOS (ABSOLUTE): 0.2 10*3/uL (ref 0.0–0.4)
Eos: 3 %
Hematocrit: 43.9 % (ref 37.5–51.0)
Hemoglobin: 14.8 g/dL (ref 13.0–17.7)
Immature Grans (Abs): 0 10*3/uL (ref 0.0–0.1)
Immature Granulocytes: 0 %
Lymphocytes Absolute: 1.9 10*3/uL (ref 0.7–3.1)
Lymphs: 34 %
MCH: 29.1 pg (ref 26.6–33.0)
MCHC: 33.7 g/dL (ref 31.5–35.7)
MCV: 86 fL (ref 79–97)
MONOS ABS: 0.6 10*3/uL (ref 0.1–0.9)
Monocytes: 10 %
NEUTROS ABS: 2.9 10*3/uL (ref 1.4–7.0)
Neutrophils: 53 %
PLATELETS: 259 10*3/uL (ref 150–379)
RBC: 5.09 x10E6/uL (ref 4.14–5.80)
RDW: 14.1 % (ref 12.3–15.4)
WBC: 5.6 10*3/uL (ref 3.4–10.8)

## 2017-06-03 LAB — TSH: TSH: 2.01 u[IU]/mL (ref 0.450–4.500)

## 2017-07-03 ENCOUNTER — Ambulatory Visit (INDEPENDENT_AMBULATORY_CARE_PROVIDER_SITE_OTHER): Payer: BLUE CROSS/BLUE SHIELD | Admitting: Unknown Physician Specialty

## 2017-07-03 ENCOUNTER — Encounter: Payer: Self-pay | Admitting: Unknown Physician Specialty

## 2017-07-03 VITALS — BP 122/74 | HR 97 | Temp 98.1°F | Ht 69.2 in | Wt 296.3 lb

## 2017-07-03 DIAGNOSIS — Z0000 Encounter for general adult medical examination without abnormal findings: Secondary | ICD-10-CM | POA: Diagnosis not present

## 2017-07-03 DIAGNOSIS — R079 Chest pain, unspecified: Secondary | ICD-10-CM | POA: Diagnosis not present

## 2017-07-03 DIAGNOSIS — R200 Anesthesia of skin: Secondary | ICD-10-CM

## 2017-07-03 DIAGNOSIS — G4733 Obstructive sleep apnea (adult) (pediatric): Secondary | ICD-10-CM | POA: Diagnosis not present

## 2017-07-03 MED ORDER — ARMODAFINIL 200 MG PO TABS: 200.0000 mg | ORAL_TABLET | Freq: Every day | ORAL | 3 refills | Status: DC

## 2017-07-03 NOTE — Assessment & Plan Note (Signed)
?   TIA.  Start 1 ASA daily.  Carotid US.  Aggressive treatment of cholesterol after receiving results

## 2017-07-03 NOTE — Assessment & Plan Note (Addendum)
Residual sleepiness.  Increase Armodafinil to 150 mg

## 2017-07-03 NOTE — Assessment & Plan Note (Signed)
Counseling on diet and exercise

## 2017-07-03 NOTE — Progress Notes (Signed)
BP 122/74   Pulse 97   Temp 98.1 F (36.7 C) (Oral)   Ht 5' 9.2" (1.758 m)   Wt 296 lb 4.8 oz (134.4 kg)   SpO2 93%   BMI 43.50 kg/m    Subjective:    Patient ID: Patrick West, male    DOB: Dec 22, 1965, 51 y.o.   MRN: [id number]  HPI: Patrick West is a 51 y.o. male  Chief Complaint  Patient presents with  . Annual Exam   Sleep apnea With persistant falling asleep despite using CPAP.  Started on Armodafinil and despite taking 100 mg, continues to fall asleep.   Wife has branded Nuvigil which seems to work better.     Facial numbness Right sided facial numbness for 3-4 days.  This seemed to go away.  No other symptoms.  Able to move that side of face but felt like a novocaine shot  Depression Low level.  ? Related to residual sleep apnea symtoms Depression screen [facility] 2/9 07/03/2017 06/01/2017 12/26/2016  Decreased Interest 0 0 3  Down, Depressed, Hopeless 0 0 3  PHQ - 2 Score 0 0 6  Altered sleeping 2 3 3   Tired, decreased energy 1 3 3   Change in appetite 2 2 2   Feeling bad or failure about yourself  2 0 3  Trouble concentrating 3 3 2   Moving slowly or fidgety/restless 1 0 3  Suicidal thoughts 1 0 1  PHQ-9 Score 12 11 23      Social History   Socioeconomic History  . Marital status: Married    Spouse name: Not on file  . Number of children: Not on file  . Years of education: Not on file  . Highest education level: Not on file  Social Needs  . Financial resource strain: Not on file  . Food insecurity - worry: Not on file  . Food insecurity - inability: Not on file  . Transportation needs - medical: Not on file  . Transportation needs - non-medical: Not on file  Occupational History  . Not on file  Tobacco Use  . Smoking status: Never Smoker  . Smokeless tobacco: Never Used  Substance and Sexual Activity  . Alcohol use: Yes    Comment: on occasion  . Drug use: No  . Sexual activity: Not on file  Other Topics Concern  . Not on file  Social History  Narrative  . Not on file   Family History  Problem Relation Age of Onset  . Heart disease Mother   . Hypertension Mother   . Heart disease Father   . Hypertension Father   . Stroke Father   . Glaucoma Father   . Breast cancer Sister 73  . Breast cancer Maternal Aunt        dx in her late 19s-30s  . Colon cancer Maternal Uncle        smoker  . Breast cancer Paternal Aunt        dx in her 35s  . Throat cancer Paternal Uncle        smoker  . Breast cancer Maternal Aunt        dx at age 68  . Colon cancer Maternal Aunt 29  . Breast cancer Paternal Aunt        dx around age 51  . Colon cancer Cousin        dx around age 33-17; paternal cousin  . Colon cancer Cousin 8  paternal cousin   Past Medical History:  Diagnosis Date  . Colon cancer ([place]) 2013   adenocarcinoma  . ED (erectile dysfunction)   . Hyperlipidemia   . Sleep apnea    getting scheduled for sleep study  . Wears dentures    partial upper   Past Surgical History:  Procedure Laterality Date  . COLONOSCOPY WITH PROPOFOL N/A 07/07/2016   Performed by Lucilla Lame, MD at [place]  . PARTIAL COLECTOMY  02/2012    Relevant past medical, surgical, family and social history reviewed and updated as indicated. Interim medical history since our last visit reviewed. Allergies and medications reviewed and updated.  Review of Systems  Constitutional: Positive for fatigue.  HENT: Negative.   Eyes: Negative.   Respiratory: Negative.   Cardiovascular: Positive for chest pain.       Dull pain for 2 days and has gone away  Gastrointestinal: Negative.   Genitourinary: Negative.   Musculoskeletal: Negative.   Neurological: Negative.   Hematological: Negative.   Psychiatric/Behavioral: Negative.     Per HPI unless specifically indicated above     Objective:    BP 122/74   Pulse 97   Temp 98.1 F (36.7 C) (Oral)   Ht 5' 9.2" (1.758 m)   Wt 296 lb 4.8 oz (134.4 kg)   SpO2 93%   BMI 43.50  kg/m   Wt Readings from Last 3 Encounters:  07/03/17 296 lb 4.8 oz (134.4 kg)  06/01/17 293 lb 3.2 oz (133 kg)  12/26/16 285 lb (129.3 kg)    Physical Exam  Constitutional: He is oriented to person, place, and time. He appears well-developed and well-nourished.  HENT:  Head: Normocephalic.  Right Ear: Tympanic membrane, external ear and ear canal normal.  Left Ear: Tympanic membrane, external ear and ear canal normal.  Mouth/Throat: Uvula is midline, oropharynx is clear and moist and mucous membranes are normal.  Eyes: Pupils are equal, round, and reactive to light.  Cardiovascular: Normal rate, regular rhythm and normal heart sounds. Exam reveals no gallop and no friction rub.  No murmur heard. Pulmonary/Chest: Effort normal and breath sounds normal. No respiratory distress.  Abdominal: Soft. Bowel sounds are normal. He exhibits no distension. There is no tenderness.  Musculoskeletal: Normal range of motion.  Neurological: He is alert and oriented to person, place, and time. He has normal reflexes.  Skin: Skin is warm and dry.  Psychiatric: He has a normal mood and affect. His behavior is normal. Judgment and thought content normal.    Results for orders placed or performed in visit on 06/02/17  CBC with Differential/Platelet  Result Value Ref Range   WBC 5.6 3.4 - 10.8 x10E3/uL   RBC 5.09 4.14 - 5.80 x10E6/uL   Hemoglobin 14.8 13.0 - 17.7 g/dL   Hematocrit 43.9 37.5 - 51.0 %   MCV 86 79 - 97 fL   MCH 29.1 26.6 - 33.0 pg   MCHC 33.7 31.5 - 35.7 g/dL   RDW 14.1 12.3 - 15.4 %   Platelets 259 150 - 379 x10E3/uL   Neutrophils 53 Not Estab. %   Lymphs 34 Not Estab. %   Monocytes 10 Not Estab. %   Eos 3 Not Estab. %   Basos 0 Not Estab. %   Neutrophils Absolute 2.9 1.4 - 7.0 x10E3/uL   Lymphocytes Absolute 1.9 0.7 - 3.1 x10E3/uL   Monocytes Absolute 0.6 0.1 - 0.9 x10E3/uL   EOS (ABSOLUTE) 0.2 0.0 - 0.4 x10E3/uL   Basophils  Absolute 0.0 0.0 - 0.2 x10E3/uL   Immature  Granulocytes 0 Not Estab. %   Immature Grans (Abs) 0.0 0.0 - 0.1 x10E3/uL  Comprehensive metabolic panel  Result Value Ref Range   Glucose 91 65 - 99 mg/dL   BUN 12 6 - 24 mg/dL   Creatinine, Ser 0.97 0.76 - 1.27 mg/dL   GFR calc non Af Amer 90 >59 mL/min/1.73   GFR calc Af Amer 104 >59 mL/min/1.73   BUN/Creatinine Ratio 12 9 - 20   Sodium 142 134 - 144 mmol/L   Potassium 3.8 3.5 - 5.2 mmol/L   Chloride 102 96 - 106 mmol/L   CO2 23 20 - 29 mmol/L   Calcium 9.6 8.7 - 10.2 mg/dL   Total Protein 7.6 6.0 - 8.5 g/dL   Albumin 4.2 3.5 - 5.5 g/dL   Globulin, Total 3.4 1.5 - 4.5 g/dL   Albumin/Globulin Ratio 1.2 1.2 - 2.2   Bilirubin Total 0.3 0.0 - 1.2 mg/dL   Alkaline Phosphatase 48 39 - 117 IU/L   AST 30 0 - 40 IU/L   ALT 44 0 - 44 IU/L  TSH  Result Value Ref Range   TSH 2.010 0.450 - 4.500 uIU/mL   EKG - NSR without acute changes.   No changes from previous EKG    Assessment & Plan:   Problem List Items Addressed This Visit      Unprioritized   Right facial numbness    ? TIA.  Start 1 ASA daily.  Carotid US.  Aggressive treatment of cholesterol after receiving results      Relevant Orders   US Carotid Duplex Bilateral   Severe obesity (BMI >= 40) (HCC)    Counseling on diet and exercise      Relevant Medications   Armodafinil 200 MG TABS   Sleep apnea    Residual sleepiness.  Increase Armodafinil to 150 mg       Other Visit Diagnoses    Chest pain, unspecified type    -  Primary   Dull pain for 2 days.  No pain at this time.  Pt high risk.  Refer to cardiology for further evaluation   Relevant Orders   EKG 12-Lead (Completed)   Ambulatory referral to Cardiology   Annual physical exam       Relevant Orders   CBC with Differential/Platelet   Comprehensive metabolic panel   Lipid Panel w/o Chol/HDL Ratio   PSA   TSH   HIV antibody       Follow up plan: Return in about 4 weeks (around 07/31/2017).

## 2017-07-04 LAB — CBC WITH DIFFERENTIAL/PLATELET
BASOS: 1 %
Basophils Absolute: 0 10*3/uL (ref 0.0–0.2)
EOS (ABSOLUTE): 0.3 10*3/uL (ref 0.0–0.4)
EOS: 4 %
HEMATOCRIT: 44.5 % (ref 37.5–51.0)
HEMOGLOBIN: 15 g/dL (ref 13.0–17.7)
IMMATURE GRANULOCYTES: 0 %
Immature Grans (Abs): 0 10*3/uL (ref 0.0–0.1)
Lymphocytes Absolute: 1.8 10*3/uL (ref 0.7–3.1)
Lymphs: 31 %
MCH: 29.4 pg (ref 26.6–33.0)
MCHC: 33.7 g/dL (ref 31.5–35.7)
MCV: 87 fL (ref 79–97)
Monocytes Absolute: 0.6 10*3/uL (ref 0.1–0.9)
Monocytes: 10 %
NEUTROS ABS: 3.2 10*3/uL (ref 1.4–7.0)
Neutrophils: 54 %
PLATELETS: 265 10*3/uL (ref 150–379)
RBC: 5.1 x10E6/uL (ref 4.14–5.80)
RDW: 14 % (ref 12.3–15.4)
WBC: 5.9 10*3/uL (ref 3.4–10.8)

## 2017-07-04 LAB — LIPID PANEL W/O CHOL/HDL RATIO
Cholesterol, Total: 263 mg/dL — ABNORMAL HIGH (ref 100–199)
HDL: 46 mg/dL (ref >39)
LDL Calculated: 172 mg/dL — ABNORMAL HIGH (ref 0–99)
Triglycerides: 225 mg/dL — ABNORMAL HIGH (ref 0–149)
VLDL Cholesterol Cal: 45 mg/dL — ABNORMAL HIGH (ref 5–40)

## 2017-07-04 LAB — COMPREHENSIVE METABOLIC PANEL
ALBUMIN: 4.2 g/dL (ref 3.5–5.5)
ALT: 54 IU/L — AB (ref 0–44)
AST: 32 IU/L (ref 0–40)
Albumin/Globulin Ratio: 1.2 (ref 1.2–2.2)
Alkaline Phosphatase: 46 IU/L (ref 39–117)
BILIRUBIN TOTAL: 0.3 mg/dL (ref 0.0–1.2)
BUN / CREAT RATIO: 14 (ref 9–20)
BUN: 13 mg/dL (ref 6–24)
CALCIUM: 9.9 mg/dL (ref 8.7–10.2)
CO2: 26 mmol/L (ref 20–29)
Chloride: 101 mmol/L (ref 96–106)
Creatinine, Ser: 0.96 mg/dL (ref 0.76–1.27)
GFR, EST AFRICAN AMERICAN: 105 mL/min/{1.73_m2} (ref >59)
GFR, EST NON AFRICAN AMERICAN: 91 mL/min/{1.73_m2} (ref >59)
GLOBULIN, TOTAL: 3.5 g/dL (ref 1.5–4.5)
Glucose: 88 mg/dL (ref 65–99)
Potassium: 4.4 mmol/L (ref 3.5–5.2)
SODIUM: 141 mmol/L (ref 134–144)
TOTAL PROTEIN: 7.7 g/dL (ref 6.0–8.5)

## 2017-07-04 LAB — PSA: PROSTATE SPECIFIC AG, SERUM: 0.6 ng/mL (ref 0.0–4.0)

## 2017-07-04 LAB — HIV ANTIBODY (ROUTINE TESTING W REFLEX): HIV SCREEN 4TH GENERATION: NONREACTIVE

## 2017-07-04 LAB — TSH: TSH: 2.81 u[IU]/mL (ref 0.450–4.500)

## 2017-07-06 ENCOUNTER — Telehealth: Payer: Self-pay | Admitting: Unknown Physician Specialty

## 2017-07-06 MED ORDER — ATORVASTATIN CALCIUM 20 MG PO TABS: 20.0000 mg | ORAL_TABLET | Freq: Every day | ORAL | 3 refills | Status: DC

## 2017-07-06 NOTE — Telephone Encounter (Signed)
The 10-year ASCVD risk score Mikey Bussing DC Brooke Bonito., et al., 2013) is: 5.7%   Values used to calculate the score:     Age: 51 years     Sex: Male     Is Non-Hispanic African American: Yes     Diabetic: No     Tobacco smoker: No     Systolic Blood Pressure: 611 mmHg     Is BP treated: No     HDL Cholesterol: 46 mg/dL     Total Cholesterol: 263 mg/dL  Discussed high cholesterol.  Start Atorvastatin 20 mg.

## 2017-07-13 ENCOUNTER — Ambulatory Visit: Payer: BLUE CROSS/BLUE SHIELD

## 2017-08-04 ENCOUNTER — Other Ambulatory Visit: Payer: Self-pay | Admitting: Unknown Physician Specialty

## 2017-08-04 ENCOUNTER — Ambulatory Visit: Payer: BLUE CROSS/BLUE SHIELD | Admitting: Unknown Physician Specialty

## 2017-08-04 ENCOUNTER — Encounter: Payer: Self-pay | Admitting: Unknown Physician Specialty

## 2017-08-04 DIAGNOSIS — G4733 Obstructive sleep apnea (adult) (pediatric): Secondary | ICD-10-CM

## 2017-08-04 DIAGNOSIS — R200 Anesthesia of skin: Secondary | ICD-10-CM | POA: Diagnosis not present

## 2017-08-04 DIAGNOSIS — R079 Chest pain, unspecified: Secondary | ICD-10-CM

## 2017-08-04 DIAGNOSIS — G5711 Meralgia paresthetica, right lower limb: Secondary | ICD-10-CM | POA: Insufficient documentation

## 2017-08-04 DIAGNOSIS — G459 Transient cerebral ischemic attack, unspecified: Secondary | ICD-10-CM

## 2017-08-04 DIAGNOSIS — E7800 Pure hypercholesterolemia, unspecified: Secondary | ICD-10-CM | POA: Insufficient documentation

## 2017-08-04 NOTE — Assessment & Plan Note (Signed)
Tolerating statin.  Will continue Atorvastatin 20 mg and recheck cholesterol when fasting

## 2017-08-04 NOTE — Assessment & Plan Note (Signed)
High risk. Appt with cardiology

## 2017-08-04 NOTE — Assessment & Plan Note (Signed)
Discussed loosening belt and losing weight

## 2017-08-04 NOTE — Assessment & Plan Note (Signed)
Check on [place]

## 2017-08-04 NOTE — Patient Instructions (Signed)
Contrave  Saxenda

## 2017-08-04 NOTE — Assessment & Plan Note (Signed)
Cut back on soft drinks.  Less fast food but unable to lose weight.  He plans to exercise

## 2017-08-04 NOTE — Progress Notes (Signed)
BP 131/82   Pulse 85   Temp 98.4 F (36.9 C) (Oral)   Wt 296 lb 9.6 oz (134.5 kg)   SpO2 94%   BMI 43.55 kg/m    Subjective:    Patient ID: Patrick West, male    DOB: [birth year], 51 y.o.   MRN: [id number]  HPI: Patrick West is a 51 y.o. male  Chief Complaint  Patient presents with  . Follow-up    4 week f/up   Sleep apnea Increased Amodafinil last visit given due to residual effects from sleep apnea.  No trouble sleeping with this medication and using CPAP nightly  Chest pain Last visit, pt had an episode of chest pain. Referred to cardiology and due to go 08/25/17.  No more episodes.    Facial numbness -  this went on for several days.  Mentioned last visit.  Carotid US ordered but pt never contacted about it.   No more numbness and we started him on Atorvastatin for an LDL of 172.    Hyperlipidemia Using medications without problems but noting since he started, anterior right leg numbness.   No Muscle aches  Diet compliance:Exercise:   Relevant past medical, surgical, family and social history reviewed and updated as indicated. Interim medical history since our last visit reviewed. Allergies and medications reviewed and updated.  Review of Systems  Per HPI unless specifically indicated above     Objective:    BP 131/82   Pulse 85   Temp 98.4 F (36.9 C) (Oral)   Wt 296 lb 9.6 oz (134.5 kg)   SpO2 94%   BMI 43.55 kg/m   Wt Readings from Last 3 Encounters:  08/04/17 296 lb 9.6 oz (134.5 kg)  07/03/17 296 lb 4.8 oz (134.4 kg)  06/01/17 293 lb 3.2 oz (133 kg)    Physical Exam  Constitutional: He is oriented to person, place, and time. He appears well-developed and well-nourished. No distress.  HENT:  Head: Normocephalic and atraumatic.  Eyes: Conjunctivae and lids are normal. Right eye exhibits no discharge. Left eye exhibits no discharge. No scleral icterus.  Neck: Normal range of motion. Neck supple. No JVD present. Carotid bruit is not present.    Cardiovascular: Normal rate, regular rhythm and normal heart sounds.  Pulmonary/Chest: Effort normal and breath sounds normal. No respiratory distress.  Abdominal: Normal appearance. There is no splenomegaly or hepatomegaly.  Musculoskeletal: Normal range of motion.  Neurological: He is alert and oriented to person, place, and time.  Skin: Skin is warm, dry and intact. No rash noted. No pallor.  Psychiatric: He has a normal mood and affect. His behavior is normal. Judgment and thought content normal.    Results for orders placed or performed in visit on 07/03/17  CBC with Differential/Platelet  Result Value Ref Range   WBC 5.9 3.4 - 10.8 x10E3/uL   RBC 5.10 4.14 - 5.80 x10E6/uL   Hemoglobin 15.0 13.0 - 17.7 g/dL   Hematocrit 44.5 37.5 - 51.0 %   MCV 87 79 - 97 fL   MCH 29.4 26.6 - 33.0 pg   MCHC 33.7 31.5 - 35.7 g/dL   RDW 14.0 12.3 - 15.4 %   Platelets 265 150 - 379 x10E3/uL   Neutrophils 54 Not Estab. %   Lymphs 31 Not Estab. %   Monocytes 10 Not Estab. %   Eos 4 Not Estab. %   Basos 1 Not Estab. %   Neutrophils Absolute 3.2 1.4 - 7.0 x10E3/uL  Lymphocytes Absolute 1.8 0.7 - 3.1 x10E3/uL   Monocytes Absolute 0.6 0.1 - 0.9 x10E3/uL   EOS (ABSOLUTE) 0.3 0.0 - 0.4 x10E3/uL   Basophils Absolute 0.0 0.0 - 0.2 x10E3/uL   Immature Granulocytes 0 Not Estab. %   Immature Grans (Abs) 0.0 0.0 - 0.1 x10E3/uL  Comprehensive metabolic panel  Result Value Ref Range   Glucose 88 65 - 99 mg/dL   BUN 13 6 - 24 mg/dL   Creatinine, Ser 0.96 0.76 - 1.27 mg/dL   GFR calc non Af Amer 91 >59 mL/min/1.73   GFR calc Af Amer 105 >59 mL/min/1.73   BUN/Creatinine Ratio 14 9 - 20   Sodium 141 134 - 144 mmol/L   Potassium 4.4 3.5 - 5.2 mmol/L   Chloride 101 96 - 106 mmol/L   CO2 26 20 - 29 mmol/L   Calcium 9.9 8.7 - 10.2 mg/dL   Total Protein 7.7 6.0 - 8.5 g/dL   Albumin 4.2 3.5 - 5.5 g/dL   Globulin, Total 3.5 1.5 - 4.5 g/dL   Albumin/Globulin Ratio 1.2 1.2 - 2.2   Bilirubin Total 0.3 0.0 -  1.2 mg/dL   Alkaline Phosphatase 46 39 - 117 IU/L   AST 32 0 - 40 IU/L   ALT 54 (H) 0 - 44 IU/L  Lipid Panel w/o Chol/HDL Ratio  Result Value Ref Range   Cholesterol, Total 263 (H) 100 - 199 mg/dL   Triglycerides 225 (H) 0 - 149 mg/dL   HDL 46 >39 mg/dL   VLDL Cholesterol Cal 45 (H) 5 - 40 mg/dL   LDL Calculated 172 (H) 0 - 99 mg/dL  PSA  Result Value Ref Range   Prostate Specific Ag, Serum 0.6 0.0 - 4.0 ng/mL  TSH  Result Value Ref Range   TSH 2.810 0.450 - 4.500 uIU/mL  HIV antibody  Result Value Ref Range   HIV Screen 4th Generation wRfx Non Reactive Non Reactive      Assessment & Plan:   Problem List Items Addressed This Visit      Unprioritized   Chest pain    High risk. Appt with cardiology      Hypercholesteremia    Tolerating statin.  Will continue Atorvastatin 20 mg and recheck cholesterol when fasting      Relevant Orders   Lipid Panel Piccolo, Waived   Meralgia paraesthetica, right    Discussed loosening belt and losing weight      Right facial numbness    Check on [place]      Severe obesity (BMI >= 40) (HCC)    Cut back on soft drinks.  Less fast food but unable to lose weight.  He plans to exercise      Sleep apnea    Residual effects improved with Armodafinil 200 mg          Follow up plan: Return in about 6 months (around 02/02/2018).

## 2017-08-04 NOTE — Assessment & Plan Note (Signed)
Residual effects improved with Armodafinil 200 mg

## 2017-08-05 ENCOUNTER — Other Ambulatory Visit: Payer: BLUE CROSS/BLUE SHIELD

## 2017-08-05 ENCOUNTER — Encounter: Payer: Self-pay | Admitting: Unknown Physician Specialty

## 2017-08-05 DIAGNOSIS — E7800 Pure hypercholesterolemia, unspecified: Secondary | ICD-10-CM

## 2017-08-05 LAB — LIPID PANEL PICCOLO, WAIVED
CHOL/HDL RATIO PICCOLO,WAIVE: 3.8 mg/dL
Cholesterol Piccolo, Waived: 209 mg/dL — ABNORMAL HIGH (ref <200)
HDL CHOL PICCOLO, WAIVED: 56 mg/dL — AB (ref >59)
LDL CHOL CALC PICCOLO WAIVED: 131 mg/dL — AB (ref <100)
Triglycerides Piccolo,Waived: 114 mg/dL (ref <150)
VLDL CHOL CALC PICCOLO,WAIVE: 23 mg/dL (ref <30)

## 2017-08-05 NOTE — Progress Notes (Addendum)
Malachy Mood, You routed me a call but theres not documentation as to why.  I think I was asking about his [place] which we resolved

## 2017-08-23 NOTE — Progress Notes (Addendum)
Cardiology Office Note  Date:  08/25/2017   ID:  Patrick West, DOB Jan 08, 1966, MRN [id number]  PCP:  Patrick Haddock, NP   Chief Complaint  West presents with  . Other    [place]. Referred by PCP for Chest pain. West c/o chest pain that comes and goes. Meds reviewed verbally with West.     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 on Patrick left Was not at work when he developed chest discomfort central part of his chest Symptoms were not severe but described as a dull ache that persisted Did not get worse with exertion Felt like he needed to pop his chest back into place but pulling his arms out in butterfly position did not seem to get it back to normal There was some reproducible chest pain on exertion Symptoms went away without intervention several days later  Developed recurrent symptoms but not as severe this past weekend Sunday into Monday Again symptoms resolved without intervention Some discomfort with palpation  He is concerned Lipitor is causing some numbness in his legs bilaterally Stop Patrick medication past several days, discomfort has resolved  Lab work reviewed with him in detail Total chol 263 LDL 172 Recently started on lipitor 20   In terms of his family history Father with ETOH, late outside all 1 night after alcohol and was told he had" heart attack" No further cardiac issues since that time, stopped smoking at Patrick time  EKG personally reviewed by myself on todays visit  Shows normal sinus rhythm with rate 88 bpm no significant ST or T wave changes   PMH:   has a past medical history  of Colon cancer ([place]) (2013), ED (erectile dysfunction), Hyperlipidemia, Sleep apnea, and Wears dentures.  PSH:    Past Surgical History:  Procedure Laterality Date  . COLONOSCOPY WITH PROPOFOL N/A 07/07/2016   Procedure: COLONOSCOPY WITH PROPOFOL;  Surgeon: Lucilla Lame, MD;  Location: [facility];  Service: Endoscopy;  Laterality: N/A;  sleep apnea  . PARTIAL COLECTOMY  02/2012    Current Outpatient Medications  Medication Sig Dispense Refill  . Armodafinil 200 MG TABS Take 200 mg daily by mouth. 30 tablet 3  . atorvastatin (LIPITOR) 20 MG tablet Take 1 tablet (20 mg total) daily by mouth. 90 tablet 3   No current facility-administered medications for this visit.      Allergies:   West has no known allergies.   Social History:  Patrick West  reports that  has never smoked. he has never used smokeless tobacco. He reports that he drinks alcohol. He reports that he does not use drugs.   Family History:   family history includes Breast cancer in his maternal aunt, maternal aunt, paternal aunt, and paternal aunt; Breast cancer (age of onset: 40) in his sister; Colon cancer in his cousin and maternal uncle; Colon cancer (age of onset: 62) in his maternal aunt; Colon cancer (age of onset: 34) in his cousin; Glaucoma in his father; Heart disease in his father and mother; Hypertension in his father and mother; Stroke in his father; Throat cancer in his paternal uncle.  Review of Systems: Review of Systems  Constitutional: Negative.   Respiratory: Negative.   Cardiovascular: Positive for chest pain.  Gastrointestinal: Negative.   Musculoskeletal: Negative.        Numbness pain down his thighs bilaterally  Neurological: Negative.   Psychiatric/Behavioral: Negative.   All other systems reviewed and are negative.    PHYSICAL EXAM: VS:  BP 130/84 (BP Location: Right Arm, West Position: Sitting, Cuff Size: Normal)   Pulse 88   Ht 5\' 11"  (1.803 m)   Wt (!) 300 lb 12 oz  (136.4 kg)   BMI 41.95 kg/m  , BMI Body mass index is 41.95 kg/m. GEN: Well nourished, well developed, in no acute distress , obese  HEENT: normal  Neck: no JVD, carotid bruits, or masses Cardiac: RRR; no murmurs, rubs, or gallops,no edema  Respiratory:  clear to auscultation bilaterally, normal work of breathing GI: soft, nontender, nondistended, + BS MS: no deformity or atrophy  Skin: warm and dry, no rash Neuro:  Strength and sensation are intact Psych: euthymic mood, full affect    Recent Labs: 07/03/2017: ALT 54; BUN 13; Creatinine, Ser 0.96; Hemoglobin 15.0; Platelets 265; Potassium 4.4; Sodium 141; TSH 2.810    Lipid Panel Lab Results  Component Value Date   CHOL 209 (H) 08/05/2017   HDL 46 07/03/2017   LDLCALC 172 (H) 07/03/2017   TRIG 114 08/05/2017      Wt Readings from Last 3 Encounters:  08/25/17 (!) 300 lb 12 oz (136.4 kg)  08/04/17 296 lb 9.6 oz (134.5 kg)  07/03/17 296 lb 4.8 oz (134.4 kg)       ASSESSMENT AND PLAN:  Chest pain, unspecified type - Plan: EKG 12-Lead, Exercise Tolerance Test Chest pain with cardiac risk factors Family history, hyperlipidemia Some atypical features, reproducible with palpation concerning for muscular ligamental disorder Discussed various treatment options with him, we will schedule him for routine treadmill study  Severe obesity (BMI >= 40) ([place]) - Plan: Exercise Tolerance Test We have encouraged continued exercise, careful diet management in an effort to lose weight.  Hypercholesteremia - Plan: Exercise Tolerance Test Currently on Lipitor 20 mg daily Reports having some numbness discomfort bilaterally lateral part of his thighs Worse after long periods of sitting, standing Thinks his symptoms are better by holding Patrick Lipitor recommended he test His symptoms on and off Patrick medications again through January If needed we may need to change Patrick statin  Obstructive sleep apnea syndrome Recommended weight  loss  Disposition:   F/U as needed   West seen in consultation for Patrick West for symptoms of chest pain will be referred back to her office for ongoing care of Patrick issues detailed above   Total encounter time more than 60 minutes  Greater than 50% was spent in counseling and coordination of care with Patrick West      Orders Placed This Encounter  Procedures  . Exercise Tolerance Test  . EKG 12-Lead     Signed, Esmond Plants, M.D., Ph.D. 08/25/2017  [place], [place]

## 2017-08-25 ENCOUNTER — Ambulatory Visit: Payer: BLUE CROSS/BLUE SHIELD | Admitting: Cardiovascular Disease

## 2017-08-25 ENCOUNTER — Encounter: Payer: Self-pay | Admitting: Cardiovascular Disease

## 2017-08-25 VITALS — BP 130/84 | HR 88 | Ht 71.0 in | Wt 300.8 lb

## 2017-08-25 DIAGNOSIS — R079 Chest pain, unspecified: Secondary | ICD-10-CM | POA: Diagnosis not present

## 2017-08-25 DIAGNOSIS — E7800 Pure hypercholesterolemia, unspecified: Secondary | ICD-10-CM | POA: Diagnosis not present

## 2017-08-25 DIAGNOSIS — G4733 Obstructive sleep apnea (adult) (pediatric): Secondary | ICD-10-CM | POA: Diagnosis not present

## 2017-08-25 NOTE — Patient Instructions (Addendum)
Medication Instructions:   No medication changes made  Labwork:  No new labs needed  Testing/Procedures:  We will schedule a routine treadmill stress test in the office for chest pain   Do not drink or eat foods with caffeine for 24 hours before the test. (Chocolate, coffee, tea, or energy drinks)  If you use an inhaler, bring it with you to the test.  Do not smoke for 4 hours before the test.  Wear comfortable shoes and clothing.  Follow-Up: It was a pleasure seeing you in the office today. Please call us if you have new issues that need to be addressed before your next appt.  [phone]  Your physician wants you to follow-up in: As needed  If you need a refill on your cardiac medications before your next appointment, please call your pharmacy.

## 2017-08-27 ENCOUNTER — Telehealth: Payer: Self-pay | Admitting: Cardiovascular Disease

## 2017-08-27 NOTE — Telephone Encounter (Signed)
Patient wants to schedule a stress test

## 2017-08-27 NOTE — Telephone Encounter (Signed)
Scheduled

## 2017-08-31 ENCOUNTER — Ambulatory Visit (INDEPENDENT_AMBULATORY_CARE_PROVIDER_SITE_OTHER): Payer: BLUE CROSS/BLUE SHIELD

## 2017-08-31 DIAGNOSIS — R079 Chest pain, unspecified: Secondary | ICD-10-CM

## 2017-08-31 DIAGNOSIS — E7800 Pure hypercholesterolemia, unspecified: Secondary | ICD-10-CM | POA: Diagnosis not present

## 2017-09-02 LAB — EXERCISE TOLERANCE TEST
CHL CUP MPHR: 169 {beats}/min
CHL CUP RESTING HR STRESS: 88 {beats}/min
CSEPEDS: 41 s
CSEPEW: 9.5 METS
Exercise duration (min): 7 min
Peak HR: 144 {beats}/min
Percent HR: 85 %

## 2017-09-07 ENCOUNTER — Telehealth: Payer: Self-pay | Admitting: Cardiovascular Disease

## 2017-09-07 NOTE — Telephone Encounter (Signed)
See results note. Results reviewed with patient and he had no further questions at this time.

## 2017-09-07 NOTE — Telephone Encounter (Signed)
Pt returning our call We called him about GXT results  Please call back

## 2017-09-22 DIAGNOSIS — J189 Pneumonia, unspecified organism: Secondary | ICD-10-CM | POA: Diagnosis not present

## 2017-09-22 DIAGNOSIS — Z9049 Acquired absence of other specified parts of digestive tract: Secondary | ICD-10-CM | POA: Diagnosis not present

## 2017-09-22 DIAGNOSIS — Z85038 Personal history of other malignant neoplasm of large intestine: Secondary | ICD-10-CM | POA: Diagnosis not present

## 2017-09-22 DIAGNOSIS — R51 Headache: Secondary | ICD-10-CM | POA: Diagnosis not present

## 2017-09-22 DIAGNOSIS — R918 Other nonspecific abnormal finding of lung field: Secondary | ICD-10-CM | POA: Diagnosis not present

## 2017-09-22 DIAGNOSIS — J3489 Other specified disorders of nose and nasal sinuses: Secondary | ICD-10-CM | POA: Diagnosis not present

## 2017-09-22 DIAGNOSIS — R05 Cough: Secondary | ICD-10-CM | POA: Diagnosis not present

## 2018-02-09 ENCOUNTER — Telehealth: Payer: Self-pay | Admitting: Unknown Physician Specialty

## 2018-02-09 MED ORDER — ARMODAFINIL 200 MG PO TABS: 200.0000 mg | ORAL_TABLET | Freq: Every day | ORAL | 0 refills | Status: DC

## 2018-02-09 NOTE — Telephone Encounter (Signed)
Copied from [place] [id number]. Topic: Quick Communication - Rx Refill/Question >> Feb 09, 2018  3:40 PM Boyd Kerbs wrote: Medication:  Armodafinil 200 MG TABS He is out of medciations  Has the patient contacted their pharmacy? No. (Agent: If no, request that the patient contact the pharmacy for the refill.) (Agent: If yes, when and what did the pharmacy advise?)  Preferred Pharmacy (with phone number or street name):   [place], [place] - [address] [place] [address] Phone: [phone] Fax: [phone]    Agent: Please be advised that RX refills may take up to 3 business days. We ask that you follow-up with your pharmacy.

## 2018-02-09 NOTE — Telephone Encounter (Signed)
Needs follow-up

## 2018-02-17 ENCOUNTER — Other Ambulatory Visit: Payer: Self-pay

## 2018-02-17 ENCOUNTER — Ambulatory Visit: Payer: BLUE CROSS/BLUE SHIELD | Admitting: Unknown Physician Specialty

## 2018-02-17 ENCOUNTER — Encounter: Payer: Self-pay | Admitting: Unknown Physician Specialty

## 2018-02-17 DIAGNOSIS — E7800 Pure hypercholesterolemia, unspecified: Secondary | ICD-10-CM | POA: Diagnosis not present

## 2018-02-17 DIAGNOSIS — G4733 Obstructive sleep apnea (adult) (pediatric): Secondary | ICD-10-CM

## 2018-02-17 MED ORDER — ARMODAFINIL 200 MG PO TABS: 200.0000 mg | ORAL_TABLET | Freq: Every day | ORAL | 2 refills | Status: DC

## 2018-02-17 NOTE — Assessment & Plan Note (Signed)
Discussed diet.  Started drinking Gatorade instead of soda.  Counseled that Gatorade and other sports drinks have a high sugar content and should be avoided

## 2018-02-17 NOTE — Assessment & Plan Note (Addendum)
Discussed that Armodafinil is not a substitute for CPAP and meant to be additional for residual symptoms while on CPAP.  Pt states he will get back on his CPAP.  Will rx Armodafinil for 3 months and will need proof of CPAP use next visit.

## 2018-02-17 NOTE — Progress Notes (Signed)
BP 131/79   Pulse 88   Temp 98.5 F (36.9 C) (Oral)   Ht 5\' 11"  (1.803 m)   Wt 293 lb (132.9 kg)   SpO2 93%   BMI 40.87 kg/m    Subjective:    Patient ID: Patrick West, male    DOB: 1965/12/20, 52 y.o.   MRN: [id number]  HPI: Patrick West is a 52 y.o. male  Chief Complaint  Patient presents with  . Follow-up   Sleep apnea Taking Armodafinil for residual effects of apnea.  This is working but pt is not using his CPAP every night (about 25%).    Depression screen [facility] 2/9 02/17/2018 07/03/2017 06/01/2017 12/26/2016  Decreased Interest 2 0 0 3  Down, Depressed, Hopeless 2 0 0 3  PHQ - 2 Score 4 0 0 6  Altered sleeping 1 2 3 3   Tired, decreased energy 2 1 3 3   Change in appetite 1 2 2 2   Feeling bad or failure about yourself  0 2 0 3  Trouble concentrating 0 3 3 2   Moving slowly or fidgety/restless 1 1 0 3  Suicidal thoughts 1 1 0 1  PHQ-9 Score 10 12 11 23    Obesity Pt lost 7 pounds since he was last seen.  He plans to try a low carb diet with salads and chicken.     Hyperlipidemia Using medications without problems: No Muscle aches  Diet compliance: low carb Exercise: Stays active at work   Relevant past medical, surgical, family and social history reviewed and updated as indicated. Interim medical history since our last visit reviewed. Allergies and medications reviewed and updated.  Review of Systems  Per HPI unless specifically indicated above     Objective:    BP 131/79   Pulse 88   Temp 98.5 F (36.9 C) (Oral)   Ht 5\' 11"  (1.803 m)   Wt 293 lb (132.9 kg)   SpO2 93%   BMI 40.87 kg/m   Wt Readings from Last 3 Encounters:  02/17/18 293 lb (132.9 kg)  08/25/17 (!) 300 lb 12 oz (136.4 kg)  08/04/17 296 lb 9.6 oz (134.5 kg)    Physical Exam  Constitutional: He is oriented to person, place, and time. He appears well-developed and well-nourished. No distress.  HENT:  Head: Normocephalic and atraumatic.  Eyes: Conjunctivae and lids are normal.  Right eye exhibits no discharge. Left eye exhibits no discharge. No scleral icterus.  Neck: Normal range of motion. Neck supple. No JVD present. Carotid bruit is not present.  Cardiovascular: Normal rate, regular rhythm and normal heart sounds.  Pulmonary/Chest: Effort normal and breath sounds normal. No respiratory distress.  Abdominal: Normal appearance. There is no splenomegaly or hepatomegaly.  Musculoskeletal: Normal range of motion.  Neurological: He is alert and oriented to person, place, and time.  Skin: Skin is warm, dry and intact. No rash noted. No pallor.  Psychiatric: He has a normal mood and affect. His behavior is normal. Judgment and thought content normal.    Results for orders placed or performed in visit on 08/31/17  Exercise Tolerance Test  Result Value Ref Range   Rest HR 88 bpm   Rest BP 155/100 mmHg   Exercise duration (sec) 41 sec   Percent HR 85 %   Exercise duration (min) 7 min   Estimated workload 9.5 METS   Peak HR 144 bpm   Peak BP 155/100 mmHg   MPHR 169 bpm      Assessment &  Plan:   Problem List Items Addressed This Visit      Unprioritized   Hypercholesteremia    Refill Atorvastatin. Check cholesterol today.  Non-fasting       Relevant Orders   Comprehensive metabolic panel   Lipid Panel w/o Chol/HDL Ratio   Severe obesity (BMI >= 40) (HCC)    Discussed diet.  Started drinking Gatorade instead of soda.  Counseled that Gatorade and other sports drinks have a high sugar content and should be avoided        Relevant Medications   Armodafinil 200 MG TABS   Sleep apnea    Discussed that Armodafinil is not a substitute for CPAP and meant to be additional for residual symptoms while on CPAP.  Pt states he will get back on his CPAP.  Will rx Armodafinil for 3 months and will need proof of CPAP use next visit.            Follow up plan: Return in about 3 months (around 05/20/2018).

## 2018-02-17 NOTE — Assessment & Plan Note (Signed)
Refill Atorvastatin. Check cholesterol today.  Non-fasting

## 2018-02-18 LAB — COMPREHENSIVE METABOLIC PANEL
A/G RATIO: 1.2 (ref 1.2–2.2)
ALT: 29 IU/L (ref 0–44)
AST: 20 IU/L (ref 0–40)
Albumin: 4.2 g/dL (ref 3.5–5.5)
Alkaline Phosphatase: 54 IU/L (ref 39–117)
BUN/Creatinine Ratio: 15 (ref 9–20)
BUN: 14 mg/dL (ref 6–24)
Bilirubin Total: 0.3 mg/dL (ref 0.0–1.2)
CALCIUM: 10 mg/dL (ref 8.7–10.2)
CO2: 24 mmol/L (ref 20–29)
CREATININE: 0.95 mg/dL (ref 0.76–1.27)
Chloride: 101 mmol/L (ref 96–106)
GFR, EST AFRICAN AMERICAN: 106 mL/min/{1.73_m2} (ref >59)
GFR, EST NON AFRICAN AMERICAN: 92 mL/min/{1.73_m2} (ref >59)
Globulin, Total: 3.6 g/dL (ref 1.5–4.5)
Glucose: 92 mg/dL (ref 65–99)
POTASSIUM: 4.1 mmol/L (ref 3.5–5.2)
SODIUM: 140 mmol/L (ref 134–144)
TOTAL PROTEIN: 7.8 g/dL (ref 6.0–8.5)

## 2018-02-18 LAB — LIPID PANEL W/O CHOL/HDL RATIO
Cholesterol, Total: 213 mg/dL — ABNORMAL HIGH (ref 100–199)
HDL: 43 mg/dL (ref >39)
LDL Calculated: 124 mg/dL — ABNORMAL HIGH (ref 0–99)
Triglycerides: 230 mg/dL — ABNORMAL HIGH (ref 0–149)
VLDL Cholesterol Cal: 46 mg/dL — ABNORMAL HIGH (ref 5–40)

## 2018-02-19 ENCOUNTER — Other Ambulatory Visit: Payer: Self-pay | Admitting: Unknown Physician Specialty

## 2018-02-19 MED ORDER — ATORVASTATIN CALCIUM 40 MG PO TABS: 40.0000 mg | ORAL_TABLET | Freq: Every day | ORAL | 1 refills | Status: DC

## 2018-02-19 NOTE — Progress Notes (Signed)
Increase atorvastatin to 40 mg.

## 2018-04-12 ENCOUNTER — Other Ambulatory Visit: Payer: Self-pay | Admitting: Family Medicine

## 2018-06-04 ENCOUNTER — Encounter: Payer: Self-pay | Admitting: Unknown Physician Specialty

## 2018-06-04 ENCOUNTER — Ambulatory Visit: Payer: BLUE CROSS/BLUE SHIELD | Admitting: Unknown Physician Specialty

## 2018-06-04 DIAGNOSIS — K429 Umbilical hernia without obstruction or gangrene: Secondary | ICD-10-CM | POA: Diagnosis not present

## 2018-06-04 DIAGNOSIS — G4733 Obstructive sleep apnea (adult) (pediatric): Secondary | ICD-10-CM

## 2018-06-04 NOTE — Assessment & Plan Note (Addendum)
Feels like he is unable to sleep on present CPAP.  Pt states he will go to the [place] to get re-evaluated.  I offered ENT appointment

## 2018-06-04 NOTE — Progress Notes (Signed)
BP 133/80 (BP Location: Left Arm, Patient Position: Sitting, Cuff Size: Large)   Pulse 85   Temp (!) 97.4 F (36.3 C) (Oral)   Ht 5\' 11"  (1.803 m)   Wt (!) 301 lb 6.4 oz (136.7 kg)   SpO2 93%   BMI 42.04 kg/m    Subjective:    Patient ID: Patrick West, male    DOB: 06-17-1966, 52 y.o.   MRN: [id number]  HPI: Patrick West is a 52 y.o. male  Chief Complaint  Patient presents with  . Weight Concern    Patient would like to discuss weight loss management.   Pt is here for concerns with his weight.  He is getting out and walking up to 4 miles 3 days/weeks. States he is having trouble with CPAP in which he "rips it off" in the middle of the night.  Already taking Nuvigil for residual sleep apnea symptoms.     Relevant past medical, surgical, family and social history reviewed and updated as indicated. Interim medical history since our last visit reviewed. Allergies and medications reviewed and updated.  Review of Systems  Constitutional: Negative.   Respiratory: Negative.   Cardiovascular: Negative.   Gastrointestinal:       Umbilical hernia seems to be getting bigger.  Having occasional pain.  No constipation  Neurological: Negative.   Psychiatric/Behavioral: Negative.     Per HPI unless specifically indicated above     Objective:    BP 133/80 (BP Location: Left Arm, Patient Position: Sitting, Cuff Size: Large)   Pulse 85   Temp (!) 97.4 F (36.3 C) (Oral)   Ht 5\' 11"  (1.803 m)   Wt (!) 301 lb 6.4 oz (136.7 kg)   SpO2 93%   BMI 42.04 kg/m   Wt Readings from Last 3 Encounters:  06/04/18 (!) 301 lb 6.4 oz (136.7 kg)  02/17/18 293 lb (132.9 kg)  08/25/17 (!) 300 lb 12 oz (136.4 kg)    Physical Exam  Constitutional: He is oriented to person, place, and time. He appears well-developed and well-nourished. No distress.  HENT:  Head: Normocephalic and atraumatic.  Eyes: Conjunctivae and lids are normal. Right eye exhibits no discharge. Left eye exhibits no  discharge. No scleral icterus.  Neck: Normal range of motion. Neck supple. No JVD present. Carotid bruit is not present.  Cardiovascular: Normal rate, regular rhythm and normal heart sounds.  Pulmonary/Chest: Effort normal and breath sounds normal. No respiratory distress.  Abdominal: Soft. Normal appearance. There is no splenomegaly or hepatomegaly. There is no tenderness.  Large umbilical hernia  Musculoskeletal: Normal range of motion.  Neurological: He is alert and oriented to person, place, and time.  Skin: Skin is warm, dry and intact. No rash noted. No pallor.  Psychiatric: He has a normal mood and affect. His behavior is normal. Judgment and thought content normal.    Results for orders placed or performed in visit on 02/17/18  Comprehensive metabolic panel  Result Value Ref Range   Glucose 92 65 - 99 mg/dL   BUN 14 6 - 24 mg/dL   Creatinine, Ser 0.95 0.76 - 1.27 mg/dL   GFR calc non Af Amer 92 >59 mL/min/1.73   GFR calc Af Amer 106 >59 mL/min/1.73   BUN/Creatinine Ratio 15 9 - 20   Sodium 140 134 - 144 mmol/L   Potassium 4.1 3.5 - 5.2 mmol/L   Chloride 101 96 - 106 mmol/L   CO2 24 20 - 29 mmol/L   Calcium  10.0 8.7 - 10.2 mg/dL   Total Protein 7.8 6.0 - 8.5 g/dL   Albumin 4.2 3.5 - 5.5 g/dL   Globulin, Total 3.6 1.5 - 4.5 g/dL   Albumin/Globulin Ratio 1.2 1.2 - 2.2   Bilirubin Total 0.3 0.0 - 1.2 mg/dL   Alkaline Phosphatase 54 39 - 117 IU/L   AST 20 0 - 40 IU/L   ALT 29 0 - 44 IU/L  Lipid Panel w/o Chol/HDL Ratio  Result Value Ref Range   Cholesterol, Total 213 (H) 100 - 199 mg/dL   Triglycerides 230 (H) 0 - 149 mg/dL   HDL 43 >39 mg/dL   VLDL Cholesterol Cal 46 (H) 5 - 40 mg/dL   LDL Calculated 124 (H) 0 - 99 mg/dL      Assessment & Plan:   Problem List Items Addressed This Visit      Unprioritized   Severe obesity (BMI >= 40) ([place])    Discussed options and complications as already on a stimulant.  Refer to medical bariatrics.  He will check with insurance  company about a nutritional referral      Sleep apnea    Feels like he is unable to sleep on present CPAP.  Pt states he will go to the [place] to get re-evaluated.  I offered ENT appointment      Umbilical hernia    Large umbilical hernia with concerns of occasional pain and "looks of it."  Refer to surgery      Relevant Orders   Ambulatory referral to General Surgery       Follow up plan: Return in about 6 months (around 12/04/2018).

## 2018-06-04 NOTE — Assessment & Plan Note (Addendum)
Discussed options and complications as already on a stimulant.  Refer to medical bariatrics.  He will check with insurance company about a nutritional referral

## 2018-06-04 NOTE — Assessment & Plan Note (Signed)
Large umbilical hernia with concerns of occasional pain and "looks of it."  Refer to surgery

## 2018-06-07 NOTE — Addendum Note (Signed)
Addended by: Sandria Manly on: 06/07/2018 11:39 AM   Modules accepted: Orders

## 2018-06-11 ENCOUNTER — Other Ambulatory Visit: Payer: Self-pay | Admitting: Unknown Physician Specialty

## 2018-06-15 ENCOUNTER — Encounter (INDEPENDENT_AMBULATORY_CARE_PROVIDER_SITE_OTHER): Payer: BLUE CROSS/BLUE SHIELD

## 2018-06-21 ENCOUNTER — Encounter: Payer: Self-pay | Admitting: Surgery

## 2018-06-21 ENCOUNTER — Ambulatory Visit: Payer: BLUE CROSS/BLUE SHIELD | Admitting: Surgery

## 2018-06-21 ENCOUNTER — Other Ambulatory Visit: Payer: Self-pay

## 2018-06-21 VITALS — BP 132/84 | HR 82 | Temp 97.9°F | Ht 71.0 in | Wt 299.0 lb

## 2018-06-21 DIAGNOSIS — K432 Incisional hernia without obstruction or gangrene: Secondary | ICD-10-CM | POA: Diagnosis not present

## 2018-06-21 NOTE — Patient Instructions (Addendum)
Patient to try to loss weight about 20 pounds. Drink water and walk three times a week. Return in one month.  The patient is aware to call back for any questions or concerns.

## 2018-06-22 NOTE — Progress Notes (Signed)
Patient ID: Patrick West, male   DOB: May 01, 1966, 52 y.o.   MRN: [id number]  HPI Patrick West is a 52 y.o. male seen in consultation at the request of Mrs. Wicker NP for symptomatic ventral hernia. He did have a history of a open sigmoid colectomy 2013 Dr. Phylis Bougie. for CA a few years ago.  I have Personally reviewed the operative report and over the last few months he has noticed a bulge and reports some intermittent pain around the hernia.  The pain is moderate intensity sharp in nature.  Worsening with Valsalva.  He is able to perform more than 4 METS of activity without any shortness of breath or chest pain.  He does not smoke and he is not a diabetic.  CMP was completely normal.  HPI  Past Medical History:  Diagnosis Date  . Colon cancer ([place]) 2013   adenocarcinoma  . ED (erectile dysfunction)   . Hyperlipidemia   . Sleep apnea    getting scheduled for sleep study  . Wears dentures    partial upper    Past Surgical History:  Procedure Laterality Date  . COLONOSCOPY WITH PROPOFOL N/A 07/07/2016   Procedure: COLONOSCOPY WITH PROPOFOL;  Surgeon: Lucilla Lame, MD;  Location: [place];  Service: Endoscopy;  Laterality: N/A;  sleep apnea  . PARTIAL COLECTOMY  02/2012    Family History  Problem Relation Age of Onset  . Heart disease Mother   . Hypertension Mother   . Heart disease Father   . Hypertension Father   . Stroke Father   . Glaucoma Father   . Breast cancer Sister 57  . Breast cancer Maternal Aunt        dx in her late 33s-30s  . Colon cancer Maternal Uncle        smoker  . Breast cancer Paternal Aunt        dx in her 71s  . Throat cancer Paternal Uncle        smoker  . Breast cancer Maternal Aunt        dx at age 40  . Colon cancer Maternal Aunt 29  . Breast cancer Paternal Aunt        dx around age 28  . Colon cancer Cousin        dx around age 35-17; paternal cousin  . Colon cancer Cousin 58       paternal cousin    Social History Social  History   Tobacco Use  . Smoking status: Never Smoker  . Smokeless tobacco: Never Used  Substance Use Topics  . Alcohol use: Yes    Comment: on occasion  . Drug use: No    No Known Allergies  Current Outpatient Medications  Medication Sig Dispense Refill  . atorvastatin (LIPITOR) 40 MG tablet Take 1 tablet (40 mg total) by mouth daily. 90 tablet 1  . LORazepam (ATIVAN) 0.5 MG tablet Take by mouth.     No current facility-administered medications for this visit.      Review of Systems Full ROS  was asked and was negative except for the information on the HPI  Physical Exam Blood pressure 132/84, pulse 82, temperature 97.9 F (36.6 C), temperature source Skin, height 5\' 11"  (1.803 m), weight 299 lb (135.6 kg). CONSTITUTIONAL: NAD EYES: Pupils are equal, round, and reactive to light, Sclera are non-icteric. EARS, NOSE, MOUTH AND THROAT: The oropharynx is clear. The oral mucosa is pink and moist. Hearing is intact to voice.  LYMPH NODES:  Lymph nodes in the neck are normal. RESPIRATORY:  Lungs are clear. There is normal respiratory effort, with equal breath sounds bilaterally, and without pathologic use of accessory muscles. CARDIOVASCULAR: Heart is regular without murmurs, gallops, or rubs. GI: The abdomen is soft, nontender, and nondistended. Reducible ventral hernia around the umbilical region.  There are no palpable masses. There is no hepatosplenomegaly. There are normal bowel sounds in all quadrants. GU: Rectal deferred.   MUSCULOSKELETAL: Normal muscle strength and tone. No cyanosis or edema.   SKIN: Turgor is good and there are no pathologic skin lesions or ulcers. NEUROLOGIC: Motor and sensation is grossly normal. Cranial nerves are grossly intact. PSYCH:  Oriented to person, place and time. Affect is normal.  Data Reviewed  I have personally reviewed the patient's imaging, laboratory findings and medical records.    Assessment/Plan  Morbidly obese male with  symptomatic ventral hernia.  Discussed with the patient in detail his BMI is closer to 42.  I discussed with him in detail that ideally he should lose weight.  A goal of 25 pounds to 30 pounds at least will be the minimum to entertain any operative intervention.  I discussed in detail with him about diet exercise and low calorie diet.  He understands and agrees with me.  I will see him back in 4 to 6 weeks to reevaluate him.  Is no other modifiable risk factors at this time that we can work on. D/w the pt about worst outcomes in hernia surgery related to obesity. Extensive counseling provided. This report was sent to the referring provider  Caroleen Hamman, MD FACS General Surgeon 06/22/2018, 3:15 PM

## 2018-07-19 ENCOUNTER — Ambulatory Visit (INDEPENDENT_AMBULATORY_CARE_PROVIDER_SITE_OTHER): Payer: BLUE CROSS/BLUE SHIELD | Admitting: Surgery

## 2018-07-19 ENCOUNTER — Other Ambulatory Visit: Payer: Self-pay

## 2018-07-19 ENCOUNTER — Encounter: Payer: Self-pay | Admitting: Surgery

## 2018-07-19 VITALS — BP 141/87 | HR 83 | Temp 97.5°F | Resp 18 | Ht 71.0 in | Wt 288.2 lb

## 2018-07-19 DIAGNOSIS — K439 Ventral hernia without obstruction or gangrene: Secondary | ICD-10-CM

## 2018-07-19 NOTE — Patient Instructions (Addendum)
Patient is to return to the office in 6 weeks.  Call the office with any questions or concerns.   Calorie Counting for Weight Loss Calories are units of energy. Your body needs a certain amount of calories from food to keep you going throughout the day. When you eat more calories than your body needs, your body stores the extra calories as fat. When you eat fewer calories than your body needs, your body burns fat to get the energy it needs. Calorie counting means keeping track of how many calories you eat and drink each day. Calorie counting can be helpful if you need to lose weight. If you make sure to eat fewer calories than your body needs, you should lose weight. Ask your health care provider what a healthy weight is for you. For calorie counting to work, you will need to eat the right number of calories in a day in order to lose a healthy amount of weight per week. A dietitian can help you determine how many calories you need in a day and will give you suggestions on how to reach your calorie goal.  A healthy amount of weight to lose per week is usually 1-2 lb (0.5-0.9 kg). This usually means that your daily calorie intake should be reduced by 500-750 calories.  Eating 1,200 - 1,500 calories per day can help most women lose weight.  Eating 1,500 - 1,800 calories per day can help most men lose weight.  What is my plan? My goal is to have __________ calories per day. If I have this many calories per day, I should lose around __________ pounds per week. What do I need to know about calorie counting? In order to meet your daily calorie goal, you will need to:  Find out how many calories are in each food you would like to eat. Try to do this before you eat.  Decide how much of the food you plan to eat.  Write down what you ate and how many calories it had. Doing this is called keeping a food log.  To successfully lose weight, it is important to balance calorie counting with a healthy  lifestyle that includes regular activity. Aim for 150 minutes of moderate exercise (such as walking) or 75 minutes of vigorous exercise (such as running) each week. Where do I find calorie information?  The number of calories in a food can be found on a Nutrition Facts label. If a food does not have a Nutrition Facts label, try to look up the calories online or ask your dietitian for help. Remember that calories are listed per serving. If you choose to have more than one serving of a food, you will have to multiply the calories per serving by the amount of servings you plan to eat. For example, the label on a package of bread might say that a serving size is 1 slice and that there are 90 calories in a serving. If you eat 1 slice, you will have eaten 90 calories. If you eat 2 slices, you will have eaten 180 calories. How do I keep a food log? Immediately after each meal, record the following information in your food log:  What you ate. Don't forget to include toppings, sauces, and other extras on the food.  How much you ate. This can be measured in cups, ounces, or number of items.  How many calories each food and drink had.  The total number of calories in the meal.  Keep your  food log near you, such as in a small notebook in your pocket, or use a mobile app or website. Some programs will calculate calories for you and show you how many calories you have left for the day to meet your goal. What are some calorie counting tips?  Use your calories on foods and drinks that will fill you up and not leave you hungry: ? Some examples of foods that fill you up are nuts and nut butters, vegetables, lean proteins, and high-fiber foods like whole grains. High-fiber foods are foods with more than 5 g fiber per serving. ? Drinks such as sodas, specialty coffee drinks, alcohol, and juices have a lot of calories, yet do not fill you up.  Eat nutritious foods and avoid empty calories. Empty calories are  calories you get from foods or beverages that do not have many vitamins or protein, such as candy, sweets, and soda. It is better to have a nutritious high-calorie food (such as an avocado) than a food with few nutrients (such as a bag of chips).  Know how many calories are in the foods you eat most often. This will help you calculate calorie counts faster.  Pay attention to calories in drinks. Low-calorie drinks include water and unsweetened drinks.  Pay attention to nutrition labels for "low fat" or "fat free" foods. These foods sometimes have the same amount of calories or more calories than the full fat versions. They also often have added sugar, starch, or salt, to make up for flavor that was removed with the fat.  Find a way of tracking calories that works for you. Get creative. Try different apps or programs if writing down calories does not work for you. What are some portion control tips?  Know how many calories are in a serving. This will help you know how many servings of a certain food you can have.  Use a measuring cup to measure serving sizes. You could also try weighing out portions on a kitchen scale. With time, you will be able to estimate serving sizes for some foods.  Take some time to put servings of different foods on your favorite plates, bowls, and cups so you know what a serving looks like.  Try not to eat straight from a bag or box. Doing this can lead to overeating. Put the amount you would like to eat in a cup or on a plate to make sure you are eating the right portion.  Use smaller plates, glasses, and bowls to prevent overeating.  Try not to multitask (for example, watch TV or use your computer) while eating. If it is time to eat, sit down at a table and enjoy your food. This will help you to know when you are full. It will also help you to be aware of what you are eating and how much you are eating. What are tips for following this plan? Reading food  labels  Check the calorie count compared to the serving size. The serving size may be smaller than what you are used to eating.  Check the source of the calories. Make sure the food you are eating is high in vitamins and protein and low in saturated and trans fats. Shopping  Read nutrition labels while you shop. This will help you make healthy decisions before you decide to purchase your food.  Make a grocery list and stick to it. Cooking  Try to cook your favorite foods in a healthier way. For example, try baking  instead of frying.  Use low-fat dairy products. Meal planning  Use more fruits and vegetables. Half of your plate should be fruits and vegetables.  Include lean proteins like poultry and fish. How do I count calories when eating out?  Ask for smaller portion sizes.  Consider sharing an entree and sides instead of getting your own entree.  If you get your own entree, eat only half. Ask for a box at the beginning of your meal and put the rest of your entree in it so you are not tempted to eat it.  If calories are listed on the menu, choose the lower calorie options.  Choose dishes that include vegetables, fruits, whole grains, low-fat dairy products, and lean protein.  Choose items that are boiled, broiled, grilled, or steamed. Stay away from items that are buttered, battered, fried, or served with cream sauce. Items labeled "crispy" are usually fried, unless stated otherwise.  Choose water, low-fat milk, unsweetened iced tea, or other drinks without added sugar. If you want an alcoholic beverage, choose a lower calorie option such as a glass of wine or light beer.  Ask for dressings, sauces, and syrups on the side. These are usually high in calories, so you should limit the amount you eat.  If you want a salad, choose a garden salad and ask for grilled meats. Avoid extra toppings like bacon, cheese, or fried items. Ask for the dressing on the side, or ask for olive oil  and vinegar or lemon to use as dressing.  Estimate how many servings of a food you are given. For example, a serving of cooked rice is  cup or about the size of half a baseball. Knowing serving sizes will help you be aware of how much food you are eating at restaurants. The list below tells you how big or small some common portion sizes are based on everyday objects: ? 1 oz-4 stacked dice. ? 3 oz-1 deck of cards. ? 1 tsp-1 die. ? 1 Tbsp- a ping-pong ball. ? 2 Tbsp-1 ping-pong ball. ?  cup- baseball. ? 1 cup-1 baseball. Summary  Calorie counting means keeping track of how many calories you eat and drink each day. If you eat fewer calories than your body needs, you should lose weight.  A healthy amount of weight to lose per week is usually 1-2 lb (0.5-0.9 kg). This usually means reducing your daily calorie intake by 500-750 calories.  The number of calories in a food can be found on a Nutrition Facts label. If a food does not have a Nutrition Facts label, try to look up the calories online or ask your dietitian for help.  Use your calories on foods and drinks that will fill you up, and not on foods and drinks that will leave you hungry.  Use smaller plates, glasses, and bowls to prevent overeating. This information is not intended to replace advice given to you by your health care provider. Make sure you discuss any questions you have with your health care provider. Document Released: 08/04/2005 Document Revised: 07/04/2016 Document Reviewed: 07/04/2016 Elsevier Interactive Patient Education  [employer]. .

## 2018-07-21 ENCOUNTER — Encounter: Payer: Self-pay | Admitting: Surgery

## 2018-07-21 NOTE — Progress Notes (Signed)
Outpatient Surgical Follow Up  07/21/2018  Patrick West is an 52 y.o. male.    HPI: F/ up for his symptomatic hernia.  Is now doing diet and exercise.  Has lost approximately 11 pounds or so. Continues to have some intermittent sharp  abdominal pain related to his hernia.  No evidence of incarceration or strangulation. No obstruction. No N/V. Taking PO.   Past Medical History:  Diagnosis Date  . Colon cancer ([place]) 2013   adenocarcinoma  . ED (erectile dysfunction)   . Hyperlipidemia   . Sleep apnea    getting scheduled for sleep study  . Wears dentures    partial upper    Past Surgical History:  Procedure Laterality Date  . COLONOSCOPY WITH PROPOFOL N/A 07/07/2016   Procedure: COLONOSCOPY WITH PROPOFOL;  Surgeon: Lucilla Lame, MD;  Location: [facility];  Service: Endoscopy;  Laterality: N/A;  sleep apnea  . PARTIAL COLECTOMY  02/2012    Family History  Problem Relation Age of Onset  . Heart disease Mother   . Hypertension Mother   . Heart disease Father   . Hypertension Father   . Stroke Father   . Glaucoma Father   . Breast cancer Sister 81  . Breast cancer Maternal Aunt        dx in her late 56s-30s  . Colon cancer Maternal Uncle        smoker  . Breast cancer Paternal Aunt        dx in her 44s  . Throat cancer Paternal Uncle        smoker  . Breast cancer Maternal Aunt        dx at age 38  . Colon cancer Maternal Aunt 29  . Breast cancer Paternal Aunt        dx around age 55  . Colon cancer Cousin        dx around age 86-17; paternal cousin  . Colon cancer Cousin 19       paternal cousin    Social History:  reports that he has never smoked. He has never used smokeless tobacco. He reports that he drinks alcohol. He reports that he does not use drugs.  Allergies: No Known Allergies  Medications reviewed.    ROS Full ROS performed and is otherwise negative other than what is stated in HPI   BP (!) 141/87   Pulse 83   Temp (!) 97.5 F  (36.4 C)   Resp 18   Ht 5\' 11"  (1.803 m)   Wt 288 lb 3.2 oz (130.7 kg)   SpO2 97%   BMI 40.20 kg/m   Physical Exam  Constitutional: He is oriented to person, place, and time. He appears well-developed and well-nourished.  Pulmonary/Chest: Effort normal. No respiratory distress.  Abdominal: Soft. He exhibits no mass. There is no rebound and no guarding. A hernia is present.  Mild tenderness around ventral hernia  Musculoskeletal: Normal range of motion.  Neurological: He is alert and oriented to person, place, and time. He displays normal reflexes. No cranial nerve deficit. He exhibits normal muscle tone. Coordination normal.  Skin: Skin is warm and dry. Capillary refill takes less than 2 seconds.  Psychiatric: He has a normal mood and affect. His behavior is normal. Judgment and thought content normal.  Vitals reviewed.      Assessment/Plan:  1. Ventral hernia without obstruction or gangrene   D/W the pt again about diet and exercise.  We have set goal to  270 pounds that that will be about 10% of his original weight.  Given that he is otherwise healthy and no other major comorbidities I do think it is a reasonable goal.  We will see him back in about 6 weeks to reevaluate his weight and hopefully at that time we will schedule him for a robotic incisional hernia repair.  Greater than 50% of the 15 minutes  visit was spent in counseling/coordination of care   Caroleen Hamman, MD [facility]

## 2018-08-15 ENCOUNTER — Other Ambulatory Visit: Payer: Self-pay | Admitting: Unknown Physician Specialty

## 2018-08-16 ENCOUNTER — Encounter: Payer: Self-pay | Admitting: Nurse Practitioner

## 2018-08-16 NOTE — Telephone Encounter (Signed)
LVM for pt to call back to schedule appt, also mailing letter.

## 2018-08-16 NOTE — Telephone Encounter (Signed)
Can we call this patient to schedule follow-up, as I have not met him and this is scheduled medication.  If he schedules follow-up I will refill.

## 2018-08-16 NOTE — Telephone Encounter (Signed)
Refill provided for 30 days.  Patient has follow-up tomorrow and will provider further refill at this visit.

## 2018-08-17 ENCOUNTER — Ambulatory Visit: Payer: BLUE CROSS/BLUE SHIELD | Admitting: Nurse Practitioner

## 2018-08-25 ENCOUNTER — Ambulatory Visit: Payer: BLUE CROSS/BLUE SHIELD | Admitting: Nurse Practitioner

## 2018-08-25 ENCOUNTER — Encounter: Payer: Self-pay | Admitting: Nurse Practitioner

## 2018-08-25 ENCOUNTER — Other Ambulatory Visit: Payer: Self-pay

## 2018-08-25 VITALS — BP 137/89 | HR 93 | Temp 98.0°F | Ht 70.0 in | Wt 287.0 lb

## 2018-08-25 DIAGNOSIS — Z79899 Other long term (current) drug therapy: Secondary | ICD-10-CM

## 2018-08-25 DIAGNOSIS — G4733 Obstructive sleep apnea (adult) (pediatric): Secondary | ICD-10-CM | POA: Diagnosis not present

## 2018-08-25 DIAGNOSIS — F322 Major depressive disorder, single episode, severe without psychotic features: Secondary | ICD-10-CM | POA: Diagnosis not present

## 2018-08-25 MED ORDER — ARMODAFINIL 200 MG PO TABS: ORAL_TABLET | ORAL | 3 refills | Status: DC

## 2018-08-25 MED ORDER — LORAZEPAM 0.5 MG PO TABS: 0.5000 mg | ORAL_TABLET | Freq: Every day | ORAL | 2 refills | Status: DC | PRN

## 2018-08-25 NOTE — Progress Notes (Signed)
BP 137/89   Pulse 93   Temp 98 F (36.7 C) (Oral)   Ht 5\' 10"  (1.778 m)   Wt 287 lb (130.2 kg)   SpO2 95%   BMI 41.18 kg/m    Subjective:    Patient ID: Patrick West, male    DOB: May 02, 1966, 53 y.o.   MRN: [id number]  HPI: PRYOR GUETTLER is a 53 y.o. male presents for follow-up  Chief Complaint  Patient presents with  . Medication Refill    armodafinil, lorazepam   ANXIETY/STRESS Reports minimal use of Ativan at home, "not even once a day".  States using "maybe a few times a week" only when "very agitated or anxious".  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  Denies any combat time.  Pt is aware of risks of benzo medication use to include increased sedation, respiratory suppression, falls, dependence and cardiovascular events.  Pt would like to continue treatment as benefit determined to outweigh risk.  Will continue to discuss alternative options. Duration:controlled Anxious mood: yes  Excessive worrying: yes Irritability: yes  Sweating: no Nausea: no Palpitations:no Hyperventilation: no Panic attacks: no Agoraphobia: no  Obscessions/compulsions: no Depressed mood: no Depression screen [facility] 2/9 08/25/2018 02/17/2018 07/03/2017 06/01/2017 12/26/2016  Decreased Interest 1 2 0 0 3  Down, Depressed, Hopeless 1 2 0 0 3  PHQ - 2 Score 2 4 0 0 6  Altered sleeping 1 1 2 3 3   Tired, decreased energy 2 2 1 3 3   Change in appetite 0 1 2 2 2   Feeling bad or failure about yourself  2 0 2 0 3  Trouble concentrating 3 0 3 3 2   Moving slowly or fidgety/restless 1 1 1  0 3  Suicidal thoughts 0 1 1 0 1  PHQ-9 Score 11 10 12 11 23   Difficult doing work/chores Not difficult at all - - - -   Anhedonia: no Weight changes: no Insomnia: none Hypersomnia: no Fatigue/loss of energy: no Feelings of worthlessness: no Feelings of guilt: no Impaired concentration/indecisiveness: no Suicidal ideations: no  Crying spells: no Recent  Stressors/Life Changes: no   Relationship problems: no   Family stress: no     Financial stress: no    Job stress: no    Recent death/loss: no  GAD 7 : Generalized Anxiety Score 08/25/2018  Nervous, Anxious, on Edge 2  Control/stop worrying 2  Worry too much - different things 2  Trouble relaxing 2  Restless 2  Easily annoyed or irritable 3  Afraid - awful might happen 2  Total GAD 7 Score 15  Anxiety Difficulty Somewhat difficult   SLEEP APNEA: Currently uses CPAP at home (about 50% of the time) and is working on weight loss, so he can improve health and have hernia surgery.  Takes Armodafinil daily, as reports he had previously had multiple episodes of falling asleep at desk at work and this medication has improved this.    OBESITY: Has lost 13 pounds since November visit.  Is working on diet and exercise, so he can have hernia surgery in upcoming year.  Relevant past medical, surgical, family and social history reviewed and updated as indicated. Interim medical history since our last visit reviewed. Allergies and medications reviewed and updated.  Review of Systems  Constitutional: Negative for activity change, diaphoresis, fatigue and fever.  Respiratory: Negative for cough, chest tightness, shortness of breath and wheezing.   Cardiovascular: Negative for chest  pain, palpitations and leg swelling.  Gastrointestinal: Negative for abdominal distention, abdominal pain, constipation, diarrhea, nausea and vomiting.  Endocrine: Negative for cold intolerance, heat intolerance, polydipsia, polyphagia and polyuria.  Musculoskeletal: Negative.   Skin: Negative.   Neurological: Negative for dizziness, syncope, weakness, light-headedness, numbness and headaches.  Psychiatric/Behavioral: Negative.     Per HPI unless specifically indicated above     Objective:    BP 137/89   Pulse 93   Temp 98 F (36.7 C) (Oral)   Ht 5\' 10"  (1.778 m)   Wt 287 lb (130.2 kg)   SpO2 95%   BMI  41.18 kg/m   Wt Readings from Last 3 Encounters:  08/25/18 287 lb (130.2 kg)  07/19/18 288 lb 3.2 oz (130.7 kg)  06/21/18 299 lb (135.6 kg)    Physical Exam Vitals signs and nursing note reviewed.  Constitutional:      General: He is awake.     Appearance: He is well-developed. He is obese. He is not ill-appearing.  HENT:     Head: Normocephalic and atraumatic.     Right Ear: Hearing normal. No drainage.     Left Ear: Hearing normal. No drainage.     Mouth/Throat:     Pharynx: Uvula midline.  Eyes:     General: Lids are normal.        Right eye: No discharge.        Left eye: No discharge.     Conjunctiva/sclera: Conjunctivae normal.     Pupils: Pupils are equal, round, and reactive to light.  Neck:     Musculoskeletal: Normal range of motion and neck supple.     Thyroid: No thyromegaly.     Vascular: No carotid bruit or JVD.     Trachea: Trachea normal.  Cardiovascular:     Rate and Rhythm: Normal rate and regular rhythm.     Heart sounds: Normal heart sounds, S1 normal and S2 normal. No murmur. No gallop.   Pulmonary:     Effort: Pulmonary effort is normal.     Breath sounds: Normal breath sounds.  Abdominal:     General: Bowel sounds are normal.     Palpations: Abdomen is soft. There is no hepatomegaly or splenomegaly.  Genitourinary:    Penis: Normal.      Rectum: Normal.  Musculoskeletal: Normal range of motion.     Right lower leg: No edema.     Left lower leg: No edema.  Skin:    General: Skin is warm and dry.     Capillary Refill: Capillary refill takes less than 2 seconds.     Findings: No rash.  Neurological:     Mental Status: He is alert and oriented to person, place, and time.     Deep Tendon Reflexes: Reflexes are normal and symmetric.  Psychiatric:        Mood and Affect: Mood normal.        Behavior: Behavior normal. Behavior is cooperative.        Thought Content: Thought content normal.        Judgment: Judgment normal.     Results for  orders placed or performed in visit on 02/17/18  Comprehensive metabolic panel  Result Value Ref Range   Glucose 92 65 - 99 mg/dL   BUN 14 6 - 24 mg/dL   Creatinine, Ser 0.95 0.76 - 1.27 mg/dL   GFR calc non Af Amer 92 >59 mL/min/1.73   GFR calc Af Amer 106 >59 mL/min/1.73  BUN/Creatinine Ratio 15 9 - 20   Sodium 140 134 - 144 mmol/L   Potassium 4.1 3.5 - 5.2 mmol/L   Chloride 101 96 - 106 mmol/L   CO2 24 20 - 29 mmol/L   Calcium 10.0 8.7 - 10.2 mg/dL   Total Protein 7.8 6.0 - 8.5 g/dL   Albumin 4.2 3.5 - 5.5 g/dL   Globulin, Total 3.6 1.5 - 4.5 g/dL   Albumin/Globulin Ratio 1.2 1.2 - 2.2   Bilirubin Total 0.3 0.0 - 1.2 mg/dL   Alkaline Phosphatase 54 39 - 117 IU/L   AST 20 0 - 40 IU/L   ALT 29 0 - 44 IU/L  Lipid Panel w/o Chol/HDL Ratio  Result Value Ref Range   Cholesterol, Total 213 (H) 100 - 199 mg/dL   Triglycerides 230 (H) 0 - 149 mg/dL   HDL 43 >39 mg/dL   VLDL Cholesterol Cal 46 (H) 5 - 40 mg/dL   LDL Calculated 124 (H) 0 - 99 mg/dL      Assessment & Plan:   Problem List Items Addressed This Visit      Respiratory   Sleep apnea    Not consistent with CPAP use and has not gone to [place] for reevaluation as was discussed at previous visit, reports wishes to wait on local OSA evaluation. Declines ENT visit.  Refill on Armodafinil sent (#30 with 2 refill).        Other   Depression, major, single episode, severe ([place]) - Primary    No maintenance medications at this time.  Uses Ativan only as needed at night.  UDS today and contract signed.  Refill sent #30 tablet + 2 refill.  Continue to encourage alternative options for depression/anxiety and GDR of Ativan.      Relevant Medications   LORazepam (ATIVAN) 0.5 MG tablet   Other Relevant Orders   Urine drugs of abuse scrn w alc, routine (Ref Lab)   Severe obesity (BMI >= 40) (HCC)    Continue to focus on diet and exercise regimen.  Has lost 13 pounds.  Offered encouragement.      Relevant Medications   Armodafinil  200 MG TABS   Controlled substance agreement signed    Signed controlled substance agreement today with patient and UDS obtained, he is aware of need to be seen every 3 months for refills and yearly UDS performance.          Follow up plan: Return in about 3 months (around 11/24/2018) for Anxiety and Insomnia.

## 2018-08-25 NOTE — Assessment & Plan Note (Signed)
No maintenance medications at this time.  Uses Ativan only as needed at night.  UDS today and contract signed.  Refill sent #30 tablet + 2 refill.  Continue to encourage alternative options for depression/anxiety and GDR of Ativan.

## 2018-08-25 NOTE — Patient Instructions (Signed)

## 2018-08-25 NOTE — Assessment & Plan Note (Signed)
Continue to focus on diet and exercise regimen.  Has lost 13 pounds.  Offered encouragement.

## 2018-08-25 NOTE — Assessment & Plan Note (Signed)
Not consistent with CPAP use and has not gone to [place] for reevaluation as was discussed at previous visit, reports wishes to wait on local OSA evaluation. Declines ENT visit.  Refill on Armodafinil sent (#30 with 2 refill).

## 2018-08-25 NOTE — Assessment & Plan Note (Signed)
Signed controlled substance agreement today with patient and UDS obtained, he is aware of need to be seen every 3 months for refills and yearly UDS performance.

## 2018-08-26 LAB — URINE DRUGS OF ABUSE SCREEN W ALC, ROUTINE (REF LAB)
Amphetamines, Urine: NEGATIVE ng/mL
Barbiturate Quant, Ur: NEGATIVE ng/mL
Benzodiazepine Quant, Ur: NEGATIVE ng/mL
Cannabinoid Quant, Ur: NEGATIVE ng/mL
Cocaine (Metab.): NEGATIVE ng/mL
Ethanol, Urine: NEGATIVE %
Methadone Screen, Urine: NEGATIVE ng/mL
OPIATE QUANT UR: NEGATIVE ng/mL
PCP Quant, Ur: NEGATIVE ng/mL
Propoxyphene: NEGATIVE ng/mL

## 2018-08-26 NOTE — Progress Notes (Signed)
Normal test results noted.  Please call patient and make them aware of normal results and will continue to monitor at regular visits.  Have a great day.  Look forward to seeing you at your next visit.

## 2018-08-30 ENCOUNTER — Ambulatory Visit: Payer: BLUE CROSS/BLUE SHIELD | Admitting: Surgery

## 2018-09-27 ENCOUNTER — Ambulatory Visit: Payer: BLUE CROSS/BLUE SHIELD | Admitting: Surgery
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
## 2018-11-24 ENCOUNTER — Other Ambulatory Visit: Payer: Self-pay

## 2018-11-24 ENCOUNTER — Encounter: Payer: Self-pay | Admitting: Nurse Practitioner

## 2018-11-24 ENCOUNTER — Ambulatory Visit (INDEPENDENT_AMBULATORY_CARE_PROVIDER_SITE_OTHER): Payer: Self-pay | Admitting: Nurse Practitioner

## 2018-11-24 VITALS — Ht 71.0 in | Wt 286.0 lb

## 2018-11-24 DIAGNOSIS — G4733 Obstructive sleep apnea (adult) (pediatric): Secondary | ICD-10-CM

## 2018-11-24 DIAGNOSIS — F5101 Primary insomnia: Secondary | ICD-10-CM

## 2018-11-24 DIAGNOSIS — F322 Major depressive disorder, single episode, severe without psychotic features: Secondary | ICD-10-CM

## 2018-11-24 MED ORDER — ARMODAFINIL 200 MG PO TABS: ORAL_TABLET | ORAL | 2 refills | Status: DC

## 2018-11-24 MED ORDER — LORAZEPAM 0.5 MG PO TABS: 0.5000 mg | ORAL_TABLET | Freq: Every day | ORAL | 2 refills | Status: DC | PRN

## 2018-11-24 NOTE — Assessment & Plan Note (Signed)
Chronic, ongoing with use of Ativan as needed.  No maintenance medications at this time, refuses SSRI. Continue to recommend alternative options for depression/anxiety and consider GDR Ativan.  Refill sent #30 and 2 refill.  Return in 3 months.

## 2018-11-24 NOTE — Assessment & Plan Note (Signed)
Chronic, ongoing.  Recommend 100% use of CPAP.  Declines ENT visit.  Refill on Armodafinil #30 and 2 refill.

## 2018-11-24 NOTE — Patient Instructions (Signed)

## 2018-11-24 NOTE — Progress Notes (Signed)
Ht 5\' 11"  (1.803 m)   Wt 286 lb (129.7 kg)   BMI 39.89 kg/m    Subjective:    Patient ID: Patrick West, male    DOB: 30-Jun-1966, 53 y.o.   MRN: [id number]  HPI: Patrick West is a 53 y.o. male  Chief Complaint  Patient presents with  . Depression    3 m f/u  . Insomnia    . This visit was completed via telephone due to the restrictions of the COVID-19 pandemic. All issues as above were discussed and addressed but no physical exam was performed. If it was felt that the patient should be evaluated in the office, they were directed there. The patient verbally consented to this visit. Patient was unable to complete an audio/visual visit due to Technical difficulties,Lack of internet. Due to the catastrophic nature of the COVID-19 pandemic, this visit was done through audio contact only. . Location of the patient: home . Location of the provider: work . Those involved with this call:  . Provider: Marnee Guarneri, DNP . CMA: Lesle Chris, [place] . Front Desk/Registration: Jill Side  . Time spent on call: 15 minutes on the phone discussing health concerns   ANXIETY Continues on Ativan as needed, states using 2-3 times a week.  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  Denies combat time.  Pt is aware of risks of benzo medication use to include increased sedation, respiratory suppression, falls, dependence and cardiovascular events.  Pt would like to continue treatment as benefit determined to outweigh risk. Is not interested in alternate options at this time, such as SSRI.  States mood is better at this time because he is out of work due to current [place] pandemic. Mood status: controlled Satisfied with current treatment?: yes Symptom severity: mild  Duration of current treatment : chronic Side effects: no Medication compliance: good compliance Psychotherapy/counseling: none Depressed mood: no Anxious mood: no Anhedonia: no Significant  weight loss or gain: no Insomnia: none Fatigue: no Feelings of worthlessness or guilt: no Impaired concentration/indecisiveness: no Suicidal ideations: no Hopelessness: no Crying spells: no Depression screen [facility] 2/9 11/24/2018 08/25/2018 02/17/2018 07/03/2017 06/01/2017  Decreased Interest 2 1 2  0 0  Down, Depressed, Hopeless 1 1 2  0 0  PHQ - 2 Score 3 2 4  0 0  Altered sleeping 1 1 1 2 3   Tired, decreased energy 1 2 2 1 3   Change in appetite 1 0 1 2 2   Feeling bad or failure about yourself  1 2 0 2 0  Trouble concentrating 1 3 0 3 3  Moving slowly or fidgety/restless 1 1 1 1  0  Suicidal thoughts 1 0 1 1 0  PHQ-9 Score 10 11 10 12 11   Difficult doing work/chores - Not difficult at all - - -   GAD 7 : Generalized Anxiety Score 11/24/2018 08/25/2018  Nervous, Anxious, on Edge 1 2  Control/stop worrying 1 2  Worry too much - different things 1 2  Trouble relaxing 2 2  Restless 1 2  Easily annoyed or irritable 1 3  Afraid - awful might happen 2 2  Total GAD 7 Score 9 15  Anxiety Difficulty - Somewhat difficult    INSOMNIA Continues on Nuvigil for OSA to prevent "faling asleep at desk".  Uses CPAP about 70% of the time. Recommended he use this 100% of the time for full health benefit. Duration: chronic Satisfied with sleep quality: yes Difficulty  falling asleep: no Difficulty staying asleep: no Waking a few hours after sleep onset: no Early morning awakenings: no Daytime hypersomnolence: no Wakes feeling refreshed: yes Good sleep hygiene: yes Apnea: no Snoring: yes Depressed/anxious mood: no Recent stress: no Restless legs/nocturnal leg cramps: no Chronic pain/arthritis: no History of sleep study: yes Treatments attempted: none   Relevant past medical, surgical, family and social history reviewed and updated as indicated. Interim medical history since our last visit reviewed. Allergies and medications reviewed and updated.  Review of Systems  Constitutional: Negative for  activity change, diaphoresis, fatigue and fever.  Respiratory: Negative for cough, chest tightness, shortness of breath and wheezing.   Cardiovascular: Negative for chest pain, palpitations and leg swelling.  Gastrointestinal: Negative for abdominal distention, abdominal pain, constipation, diarrhea, nausea and vomiting.  Musculoskeletal: Negative.   Skin: Negative.   Neurological: Negative for dizziness, syncope, weakness, light-headedness, numbness and headaches.  Psychiatric/Behavioral: Negative.     Per HPI unless specifically indicated above     Objective:    Ht 5\' 11"  (1.803 m)   Wt 286 lb (129.7 kg)   BMI 39.89 kg/m   Wt Readings from Last 3 Encounters:  11/24/18 286 lb (129.7 kg)  08/25/18 287 lb (130.2 kg)  07/19/18 288 lb 3.2 oz (130.7 kg)    Physical Exam   Unable to perform due to telephone visit only, patient with no video access.  Results for orders placed or performed in visit on 08/25/18  Urine drugs of abuse scrn w alc, routine (Ref Lab)  Result Value Ref Range   Amphetamines, Urine Negative Cutoff=1000 ng/mL   Barbiturate Quant, Ur Negative Cutoff=300 ng/mL   Benzodiazepine Quant, Ur Negative Cutoff=300 ng/mL   Cannabinoid Quant, Ur Negative Cutoff=50 ng/mL   Cocaine (Metab.) Negative Cutoff=300 ng/mL   Opiate Quant, Ur Negative Cutoff=300 ng/mL   PCP Quant, Ur Negative Cutoff=25 ng/mL   Methadone Screen, Urine Negative Cutoff=300 ng/mL   Propoxyphene Negative Cutoff=300 ng/mL   Ethanol, Urine Negative Cutoff=0.020 %      Assessment & Plan:   Problem List Items Addressed This Visit      Respiratory   Sleep apnea    Chronic, ongoing.  Recommend 100% use of CPAP.  Declines ENT visit.  Refill on Armodafinil #30 and 2 refill.        Other   Depression, major, single episode, severe ([place]) - Primary    Chronic, ongoing with use of Ativan as needed.  No maintenance medications at this time, refuses SSRI. Continue to recommend alternative options for  depression/anxiety and consider GDR Ativan.  Refill sent #30 and 2 refill.  Return in 3 months.      Relevant Medications   LORazepam (ATIVAN) 0.5 MG tablet   Insomnia    Ongoing. Recommend continued use of Ativan as needed and use of CPAP 100% of the time.           Controlled substance.  Checked data base and no other refills of controlled substances noted, last refill 11/11/2018.  I discussed the assessment and treatment plan with the patient. The patient was provided an opportunity to ask questions and all were answered. The patient agreed with the plan and demonstrated an understanding of the instructions.   The patient was advised to call back or seek an in-person evaluation if the symptoms worsen or if the condition fails to improve as anticipated.   I provided 15 minutes of time during this encounter.  Follow up plan: Return in about 3  months (around 02/23/2019) for OSA, mood, and obesity.

## 2018-11-24 NOTE — Assessment & Plan Note (Signed)
Ongoing. Recommend continued use of Ativan as needed and use of CPAP 100% of the time.

## 2018-12-10 MED ORDER — PHENOL 1.4 % MT LIQD
2.00 | OROMUCOSAL | Status: DC
Start: ? — End: 2018-12-10

## 2018-12-10 MED ORDER — GENERIC EXTERNAL MEDICATION
Status: DC
Start: ? — End: 2018-12-10

## 2018-12-10 MED ORDER — FAMOTIDINE 20 MG/2ML IV SOLN
20.00 | INTRAVENOUS | Status: DC
Start: 2018-12-10 — End: 2018-12-10

## 2018-12-10 MED ORDER — ONDANSETRON 4 MG PO TBDP
4.00 | ORAL_TABLET | ORAL | Status: DC
Start: ? — End: 2018-12-10

## 2018-12-10 MED ORDER — SODIUM CHLORIDE 0.9 % IV SOLN
100.00 | INTRAVENOUS | Status: DC
Start: ? — End: 2018-12-10

## 2018-12-10 MED ORDER — HEPARIN SODIUM (PORCINE) 5000 UNIT/ML IJ SOLN
5000.00 | INTRAMUSCULAR | Status: DC
Start: 2018-12-10 — End: 2018-12-10

## 2019-01-31 ENCOUNTER — Telehealth: Payer: Self-pay

## 2019-01-31 NOTE — Telephone Encounter (Signed)
PA submitted and approved for Armodafinil. Key: [id number]

## 2019-03-04 ENCOUNTER — Ambulatory Visit: Payer: Self-pay | Admitting: Nurse Practitioner

## 2019-04-18 ENCOUNTER — Ambulatory Visit (INDEPENDENT_AMBULATORY_CARE_PROVIDER_SITE_OTHER): Payer: Managed Care, Other (non HMO) | Admitting: Family Medicine

## 2019-04-18 ENCOUNTER — Other Ambulatory Visit: Payer: Self-pay

## 2019-04-18 ENCOUNTER — Encounter: Payer: Self-pay | Admitting: Family Medicine

## 2019-04-18 VITALS — BP 139/90 | HR 87 | Temp 98.1°F | Ht 69.0 in | Wt 277.0 lb

## 2019-04-18 DIAGNOSIS — Z85038 Personal history of other malignant neoplasm of large intestine: Secondary | ICD-10-CM | POA: Diagnosis not present

## 2019-04-18 DIAGNOSIS — Z125 Encounter for screening for malignant neoplasm of prostate: Secondary | ICD-10-CM | POA: Diagnosis not present

## 2019-04-18 DIAGNOSIS — Z0000 Encounter for general adult medical examination without abnormal findings: Secondary | ICD-10-CM

## 2019-04-18 DIAGNOSIS — E7800 Pure hypercholesterolemia, unspecified: Secondary | ICD-10-CM

## 2019-04-18 NOTE — Assessment & Plan Note (Signed)
Has not been taking his medicine regularly. Checking labs today. Refill given. Continue to monitor. Call with any concerns.

## 2019-04-18 NOTE — Assessment & Plan Note (Signed)
Congratulated patient on 9lb weight loss. Continue diet and exercise with goal of losing 1-2lbs per week. Continue to monitor.

## 2019-04-18 NOTE — Progress Notes (Signed)
BP 139/90   Pulse 87   Temp 98.1 F (36.7 C) (Oral)   Ht 5\' 9"  (1.753 m)   Wt 277 lb (125.6 kg)   SpO2 92%   BMI 40.91 kg/m    Subjective:    Patient ID: Patrick West, male    DOB: August 13, 1966, 53 y.o.   MRN: [id number]  HPI: Patrick West is a 53 y.o. male presenting on 04/18/2019 for comprehensive medical examination. Current medical complaints include:  HYPERLIPIDEMIA Hyperlipidemia status: unknown Satisfied with current treatment?  yes Side effects:  no Medication compliance: poor compliance - last Rx given 1 year ago for 6 months and still has several pills left on that Rx Past cholesterol meds: atorvastatin Supplements: none Aspirin:  no The 10-year ASCVD risk score Mikey Bussing DC Jr., et al., 2013) is: 7.5%   Values used to calculate the score:     Age: 70 years     Sex: Male     Is Non-Hispanic African American: Yes     Diabetic: No     Tobacco smoker: No     Systolic Blood Pressure: [id number] mmHg     Is BP treated: No     HDL Cholesterol: 43 mg/dL     Total Cholesterol: 213 mg/dL Chest pain:  no Coronary artery disease:  no  He currently lives with: wife Interim Problems from his last visit: yes -emergency surgery for incarcerated ventral hernia  Depression Screen done today and results listed below:  Depression screen [facility] 2/9 04/18/2019 11/24/2018 08/25/2018 02/17/2018 07/03/2017  Decreased Interest 0 2 1 2  0  Down, Depressed, Hopeless 1 1 1 2  0  PHQ - 2 Score 1 3 2 4  0  Altered sleeping 1 1 1 1 2   Tired, decreased energy 2 1 2 2 1   Change in appetite 1 1 0 1 2  Feeling bad or failure about yourself  0 1 2 0 2  Trouble concentrating 1 1 3  0 3  Moving slowly or fidgety/restless 0 1 1 1 1   Suicidal thoughts 0 1 0 1 1  PHQ-9 Score 6 10 11 10 12   Difficult doing work/chores Somewhat difficult - Not difficult at all - -    Past Medical History:  Past Medical History:  Diagnosis Date  . 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 ED (erectile dysfunction)   .  Hyperlipidemia   . Sleep apnea    getting scheduled for sleep study  . Wears dentures    partial upper    Surgical History:  Past Surgical History:  Procedure Laterality Date  . ABDOMINAL HERNIA REPAIR    . COLONOSCOPY WITH PROPOFOL N/A 07/07/2016   Procedure: COLONOSCOPY WITH PROPOFOL;  Surgeon: Lucilla Lame, MD;  Location: [facility];  Service: Endoscopy;  Laterality: N/A;  sleep apnea  . PARTIAL COLECTOMY  02/2012    Medications:  Current Outpatient Medications on File Prior to Visit  Medication Sig  . Armodafinil 200 MG TABS TAKE 200MG  BY MOUTH DAILY  . atorvastatin (LIPITOR) 40 MG tablet Take 1 tablet (40 mg total) by mouth daily.  Marland Kitchen LORazepam (ATIVAN) 0.5 MG tablet Take 1 tablet (0.5 mg total) by mouth daily as needed for anxiety.   No current facility-administered medications on file prior to visit.     Allergies:  No Known Allergies  Social History:  Social History   Socioeconomic History  . Marital status: Married    Spouse name: Not on file  . Number  of children: Not on file  . Years of education: Not on file  . Highest education level: Not on file  Occupational History  . Not on file  Social Needs  . Financial resource strain: Not on file  . Food insecurity    Worry: Not on file    Inability: Not on file  . Transportation needs    Medical: Not on file    Non-medical: Not on file  Tobacco Use  . Smoking status: Never Smoker  . Smokeless tobacco: Never Used  Substance and Sexual Activity  . Alcohol use: Yes    Alcohol/week: 1.0 standard drinks    Types: 1 Glasses of wine per week    Comment: on occasion  . Drug use: No  . Sexual activity: Not on file  Lifestyle  . Physical activity    Days per week: Not on file    Minutes per session: Not on file  . Stress: Not on file  Relationships  . Social Herbalist on phone: Not on file    Gets together: Not on file    Attends religious service: Not on file    Active member of club  or organization: Not on file    Attends meetings of clubs or organizations: Not on file    Relationship status: Not on file  . Intimate partner violence    Fear of current or ex partner: Not on file    Emotionally abused: Not on file    Physically abused: Not on file    Forced sexual activity: Not on file  Other Topics Concern  . Not on file  Social History Narrative  . Not on file   Social History   Tobacco Use  Smoking Status Never Smoker  Smokeless Tobacco Never Used   Social History   Substance and Sexual Activity  Alcohol Use Yes  . Alcohol/week: 1.0 standard drinks  . Types: 1 Glasses of wine per week   Comment: on occasion    Family History:  Family History  Problem Relation Age of Onset  . Heart disease Mother   . Hypertension Mother   . Heart disease Father   . Hypertension Father   . Stroke Father   . Glaucoma Father   . Breast cancer Sister 77  . Breast cancer Maternal Aunt        dx in her late 20s-30s  . Colon cancer Maternal Uncle        smoker  . Breast cancer Paternal Aunt        dx in her 66s  . Throat cancer Paternal Uncle        smoker  . Breast cancer Maternal Aunt        dx at age 76  . Colon cancer Maternal Aunt 29  . Breast cancer Paternal Aunt        dx around age 44  . Colon cancer Cousin        dx around age 83-17; paternal cousin  . Colon cancer Cousin 110       paternal cousin    Past medical history, surgical history, medications, allergies, family history and social history reviewed with patient today and changes made to appropriate areas of the chart.   Review of Systems  Constitutional: Positive for weight loss. Negative for chills, diaphoresis, fever and malaise/fatigue.  HENT: Negative.   Eyes: Negative.   Respiratory: Negative.   Cardiovascular: Negative.   Gastrointestinal: Negative.   Genitourinary: Negative.  Musculoskeletal: Negative.   Skin: Negative.   Neurological: Negative.   Endo/Heme/Allergies:  Negative.   Psychiatric/Behavioral: Positive for depression. Negative for hallucinations, memory loss, substance abuse and suicidal ideas. The patient has insomnia. The patient is not nervous/anxious.     All other ROS negative except what is listed above and in the HPI.      Objective:    BP 139/90   Pulse 87   Temp 98.1 F (36.7 C) (Oral)   Ht 5\' 9"  (1.753 m)   Wt 277 lb (125.6 kg)   SpO2 92%   BMI 40.91 kg/m   Wt Readings from Last 3 Encounters:  04/18/19 277 lb (125.6 kg)  11/24/18 286 lb (129.7 kg)  08/25/18 287 lb (130.2 kg)    Physical Exam Vitals signs and nursing note reviewed.  Constitutional:      General: He is not in acute distress.    Appearance: Normal appearance. He is obese. He is not ill-appearing, toxic-appearing or diaphoretic.  HENT:     Head: Normocephalic and atraumatic.     Right Ear: Tympanic membrane, ear canal and external ear normal. There is no impacted cerumen.     Left Ear: Tympanic membrane, ear canal and external ear normal. There is no impacted cerumen.     Nose: Nose normal. No congestion or rhinorrhea.     Mouth/Throat:     Mouth: Mucous membranes are moist.     Pharynx: Oropharynx is clear. No oropharyngeal exudate or posterior oropharyngeal erythema.  Eyes:     General: No scleral icterus.       Right eye: No discharge.        Left eye: No discharge.     Extraocular Movements: Extraocular movements intact.     Conjunctiva/sclera: Conjunctivae normal.     Pupils: Pupils are equal, round, and reactive to light.  Neck:     Musculoskeletal: Normal range of motion and neck supple. No neck rigidity or muscular tenderness.     Vascular: No carotid bruit.  Cardiovascular:     Rate and Rhythm: Normal rate and regular rhythm.     Pulses: Normal pulses.     Heart sounds: No murmur. No friction rub. No gallop.   Pulmonary:     Effort: Pulmonary effort is normal. No respiratory distress.     Breath sounds: Normal breath sounds. No stridor.  No wheezing, rhonchi or rales.  Chest:     Chest wall: No tenderness.  Abdominal:     General: Abdomen is flat. Bowel sounds are normal. There is no distension.     Palpations: Abdomen is soft. There is no mass.     Tenderness: There is no abdominal tenderness. There is no right CVA tenderness, left CVA tenderness, guarding or rebound.     Hernia: No hernia is present.  Genitourinary:    Comments: Genital exam deferred with shared decision making Musculoskeletal:        General: No swelling, tenderness, deformity or signs of injury.     Right lower leg: No edema.     Left lower leg: No edema.  Lymphadenopathy:     Cervical: No cervical adenopathy.  Skin:    General: Skin is warm and dry.     Capillary Refill: Capillary refill takes less than 2 seconds.     Coloration: Skin is not jaundiced or pale.     Findings: No bruising, erythema, lesion or rash.  Neurological:     General: No focal deficit present.  Mental Status: He is alert and oriented to person, place, and time.     Cranial Nerves: No cranial nerve deficit.     Sensory: No sensory deficit.     Motor: No weakness.     Coordination: Coordination normal.     Gait: Gait normal.     Deep Tendon Reflexes: Reflexes normal.  Psychiatric:        Mood and Affect: Mood normal.        Behavior: Behavior normal.        Thought Content: Thought content normal.        Judgment: Judgment normal.     Results for orders placed or performed in visit on 04/18/19  Bayer DCA Hb A1c Waived  Result Value Ref Range   HB A1C (BAYER DCA - WAIVED) 5.7 <7.0 %  UA/M w/rflx Culture, Routine   Specimen: Blood   BLD  Result Value Ref Range   Specific Gravity, UA WILL FOLLOW    pH, UA WILL FOLLOW    Color, UA WILL FOLLOW    Appearance Ur WILL FOLLOW    Leukocytes,UA WILL FOLLOW    Protein,UA WILL FOLLOW    Glucose, UA WILL FOLLOW    Ketones, UA WILL FOLLOW    RBC, UA WILL FOLLOW    Bilirubin, UA WILL FOLLOW    Urobilinogen, Ur WILL  FOLLOW    Nitrite, UA WILL FOLLOW    Microscopic Examination WILL FOLLOW    Microscopic Examination WILL FOLLOW    Urinalysis Reflex WILL FOLLOW       Assessment & Plan:   Problem List Items Addressed This Visit      Other   Personal history of colon cancer    Diagnosed in 2013. S/p colectomy. Follow up colonoscopies in 2015 and 2017 were negative. Follows with [facility] oncology- to have colonoscopy in May 2020, has not been done yet. New referral to [facility] GI placed today to try to move this along. Call with any concerns. Labs drawn today.      Relevant Orders   Comprehensive metabolic panel   CBC with Differential/Platelet   UA/M w/rflx Culture, Routine (Completed)   Ambulatory referral to Gastroenterology   Severe obesity (BMI >= 40) ([place])    Congratulated patient on 9lb weight loss. Continue diet and exercise with goal of losing 1-2lbs per week. Continue to monitor.       Relevant Orders   Bayer DCA Hb A1c Waived (Completed)   Comprehensive metabolic panel   TSH   UA/M w/rflx Culture, Routine (Completed)   Hypercholesteremia    Has not been taking his medicine regularly. Checking labs today. Refill given. Continue to monitor. Call with any concerns.       Relevant Orders   Comprehensive metabolic panel   Lipid Panel w/o Chol/HDL Ratio   UA/M w/rflx Culture, Routine (Completed)    Other Visit Diagnoses    Routine general medical examination at a health care facility    -  Primary   Vaccines up to date/declined. Screening labs checked today. Colonoscopy was due in May- not done yet. Continue diet and exercise. Call with any concerns.    Screening for prostate cancer       Labs drawn today. Await results.    Relevant Orders   PSA       LABORATORY TESTING:  Health maintenance labs ordered today as discussed above.   The natural history of prostate cancer and ongoing controversy regarding screening and potential treatment outcomes of  prostate cancer has been discussed with  the patient. The meaning of a false positive PSA and a false negative PSA has been discussed. He indicates understanding of the limitations of this screening test and wishes to proceed with screening PSA testing.   IMMUNIZATIONS:   - Tdap: Tetanus vaccination status reviewed: last tetanus booster within 10 years. - Influenza: Refused - Pneumovax: Not applicable - Prevnar: Not applicable - HPV: Not applicable - Zostavax vaccine: Given elsewhere  SCREENING: - Colonoscopy: Up to date  Discussed with patient purpose of the colonoscopy is to detect colon cancer at curable precancerous or early stages   PATIENT COUNSELING:    Sexuality: Discussed sexually transmitted diseases, partner selection, use of condoms, avoidance of unintended pregnancy  and contraceptive alternatives.   Advised to avoid cigarette smoking.  I discussed with the patient that most people either abstain from alcohol or drink within safe limits (<=14/week and <=4 drinks/occasion for males, <=7/weeks and <= 3 drinks/occasion for females) and that the risk for alcohol disorders and other health effects rises proportionally with the number of drinks per week and how often a drinker exceeds daily limits.  Discussed cessation/primary prevention of drug use and availability of treatment for abuse.   Diet: Encouraged to adjust caloric intake to maintain  or achieve ideal body weight, to reduce intake of dietary saturated fat and total fat, to limit sodium intake by avoiding high sodium foods and not adding table salt, and to maintain adequate dietary potassium and calcium preferably from fresh fruits, vegetables, and low-fat dairy products.    stressed the importance of regular exercise  Injury prevention: Discussed safety belts, safety helmets, smoke detector, smoking near bedding or upholstery.   Dental health: Discussed importance of regular tooth brushing, flossing, and dental visits.   Follow up plan: NEXT PREVENTATIVE  PHYSICAL DUE IN 1 YEAR. Return ASAP for follow up anxiety/OSA with Jolene (virtual [place]).

## 2019-04-18 NOTE — Patient Instructions (Addendum)
Health Maintenance, Male Adopting a healthy lifestyle and getting preventive care are important in promoting health and wellness. Ask your health care provider about:  The right schedule for you to have regular tests and exams.  Things you can do on your own to prevent diseases and keep yourself healthy. What should I know about diet, weight, and exercise? Eat a healthy diet   Eat a diet that includes plenty of vegetables, fruits, low-fat dairy products, and lean protein.  Do not eat a lot of foods that are high in solid fats, added sugars, or sodium. Maintain a healthy weight Body mass index (BMI) is a measurement that can be used to identify possible weight problems. It estimates body fat based on height and weight. Your health care provider can help determine your BMI and help you achieve or maintain a healthy weight. Get regular exercise Get regular exercise. This is one of the most important things you can do for your health. Most adults should:  Exercise for at least 150 minutes each week. The exercise should increase your heart rate and make you sweat (moderate-intensity exercise).  Do strengthening exercises at least twice a week. This is in addition to the moderate-intensity exercise.  Spend less time sitting. Even light physical activity can be beneficial. Watch cholesterol and blood lipids Have your blood tested for lipids and cholesterol at 53 years of age, then have this test every 5 years. You may need to have your cholesterol levels checked more often if:  Your lipid or cholesterol levels are high.  You are older than 53 years of age.  You are at high risk for heart disease. What should I know about cancer screening? Many types of cancers can be detected early and may often be prevented. Depending on your health history and family history, you may need to have cancer screening at various ages. This may include screening for:  Colorectal cancer.  Prostate cancer.   Skin cancer.  Lung cancer. What should I know about heart disease, diabetes, and high blood pressure? Blood pressure and heart disease  High blood pressure causes heart disease and increases the risk of stroke. This is more likely to develop in people who have high blood pressure readings, are of African descent, or are overweight.  Talk with your health care provider about your target blood pressure readings.  Have your blood pressure checked: ? Every 3-5 years if you are 53-12 years of age. ? Every year if you are 53 years old or older.  If you are between the ages of 53 and 39 and are a current or former smoker, ask your health care provider if you should have a one-time screening for abdominal aortic aneurysm (AAA). Diabetes Have regular diabetes screenings. This checks your fasting blood sugar level. Have the screening done:  Once every three years after age 15 if you are at a normal weight and have a low risk for diabetes.  More often and at a younger age if you are overweight or have a high risk for diabetes. What should I know about preventing infection? Hepatitis B If you have a higher risk for hepatitis B, you should be screened for this virus. Talk with your health care provider to find out if you are at risk for hepatitis B infection. Hepatitis C Blood testing is recommended for:  Everyone born from [birth year] through 1965.  Anyone with known risk factors for hepatitis C. Sexually transmitted infections (STIs)  You should be screened each year  for STIs, including gonorrhea and chlamydia, if: ? You are sexually active and are younger than 53 years of age. ? You are older than 53 years of age and your health care provider tells you that you are at risk for this type of infection. ? Your sexual activity has changed since you were last screened, and you are at increased risk for chlamydia or gonorrhea. Ask your health care provider if you are at risk.  Ask your health care  provider about whether you are at high risk for HIV. Your health care provider may recommend a prescription medicine to help prevent HIV infection. If you choose to take medicine to prevent HIV, you should first get tested for HIV. You should then be tested every 3 months for as long as you are taking the medicine. Follow these instructions at home: Lifestyle  Do not use any products that contain nicotine or tobacco, such as cigarettes, e-cigarettes, and chewing tobacco. If you need help quitting, ask your health care provider.  Do not use street drugs.  Do not share needles.  Ask your health care provider for help if you need support or information about quitting drugs. Alcohol use  Do not drink alcohol if your health care provider tells you not to drink.  If you drink alcohol: ? Limit how much you have to 0-2 drinks a day. ? Be aware of how much alcohol is in your drink. In the [place] one drink equals one 12 oz bottle of beer (355 mL), one 5 oz glass of wine (148 mL), or one 1 oz glass of hard liquor (44 mL). General instructions  Schedule regular health, dental, and eye exams.  Stay current with your vaccines.  Tell your health care provider if: ? You often feel depressed. ? You have ever been abused or do not feel safe at home. Summary  Adopting a healthy lifestyle and getting preventive care are important in promoting health and wellness.  Follow your health care provider's instructions about healthy diet, exercising, and getting tested or screened for diseases.  Follow your health care provider's instructions on monitoring your cholesterol and blood pressure. This information is not intended to replace advice given to you by your health care provider. Make sure you discuss any questions you have with your health care provider. Document Released: 01/31/2008 Document Revised: 07/28/2018 Document Reviewed: 07/28/2018 Elsevier Patient Education  2020 [place]  Band Syndrome Rehab Ask your health care provider which exercises are safe for you. Do exercises exactly as told by your health care provider and adjust them as directed. It is normal to feel mild stretching, pulling, tightness, or discomfort as you do these exercises. Stop right away if you feel sudden pain or your pain gets significantly worse. Do not begin these exercises until told by your health care provider. Stretching and range-of-motion exercises These exercises warm up your muscles and joints and improve the movement and flexibility of your hip and pelvis. Quadriceps stretch, prone  1. Lie on your abdomen on a firm surface, such as a bed or padded floor (prone position). 2. Bend your left / right knee and reach back to hold your ankle or pant leg. If you cannot reach your ankle or pant leg, loop a belt around your foot and grab the belt instead. 3. Gently pull your heel toward your buttocks. Your knee should not slide out to the side. You should feel a stretch in the front of your thigh and knee (quadriceps).  4. Hold this position for __________ seconds. Repeat __________ times. Complete this exercise __________ times a day. Iliotibial band stretch An iliotibial band is a strong band of muscle tissue that runs from the outer side of your hip to the outer side of your thigh and knee. 1. Lie on your side with your left / right leg in the top position. 2. Bend both of your knees and grab your left / right ankle. Stretch out your bottom arm to help you balance. 3. Slowly bring your top knee back so your thigh goes behind your trunk. 4. Slowly lower your top leg toward the floor until you feel a gentle stretch on the outside of your left / right hip and thigh. If you do not feel a stretch and your knee will not fall farther, place the heel of your other foot on top of your knee and pull your knee down toward the floor with your foot. 5. Hold this position for __________ seconds. Repeat  __________ times. Complete this exercise __________ times a day. Strengthening exercises These exercises build strength and endurance in your hip and pelvis. Endurance is the ability to use your muscles for a long time, even after they get tired. Straight leg raises, side-lying This exercise strengthens the muscles that rotate the leg at the hip and move it away from your body (hip abductors). 1. Lie on your side with your left / right leg in the top position. Lie so your head, shoulder, hip, and knee line up. You may bend your bottom knee to help you balance. 2. Roll your hips slightly forward so your hips are stacked directly over each other and your left / right knee is facing forward. 3. Tense the muscles in your outer thigh and lift your top leg 4-6 inches (10-15 cm). 4. Hold this position for __________ seconds. 5. Slowly return to the starting position. Let your muscles relax completely before doing another repetition. Repeat __________ times. Complete this exercise __________ times a day. Leg raises, prone This exercise strengthens the muscles that move the hips (hip extensors). 1. Lie on your abdomen on your bed or a firm surface. You can put a pillow under your hips if that is more comfortable for your lower back. 2. Bend your left / right knee so your foot is straight up in the air. 3. Squeeze your buttocks muscles and lift your left / right thigh off the bed. Do not let your back arch. 4. Tense your thigh muscle as hard as you can without increasing any knee pain. 5. Hold this position for __________ seconds. 6. Slowly lower your leg to the starting position and allow it to relax completely. Repeat __________ times. Complete this exercise __________ times a day. Hip hike 1. Stand sideways on a bottom step. Stand on your left / right leg with your other foot unsupported next to the step. You can hold on to the railing or wall for balance if needed. 2. Keep your knees straight and  your torso square. Then lift your left / right hip up toward the ceiling. 3. Slowly let your left / right hip lower toward the floor, past the starting position. Your foot should get closer to the floor. Do not lean or bend your knees. Repeat __________ times. Complete this exercise __________ times a day. This information is not intended to replace advice given to you by your health care provider. Make sure you discuss any questions you have with your health care provider.  Document Released: 08/04/2005 Document Revised: 11/25/2018 Document Reviewed: 05/26/2018 Elsevier Patient Education  2020 [employer].

## 2019-04-18 NOTE — Assessment & Plan Note (Addendum)
Diagnosed in 2013. S/p colectomy. Follow up colonoscopies in 2015 and 2017 were negative. Follows with [facility] oncology- to have colonoscopy in May 2020, has not been done yet. New referral to [facility] placed today to try to move this along. Call with any concerns. Labs drawn today.

## 2019-04-19 LAB — UA/M W/RFLX CULTURE, ROUTINE
Bilirubin, UA: NEGATIVE
Glucose, UA: NEGATIVE
Ketones, UA: NEGATIVE
Leukocytes,UA: NEGATIVE
Nitrite, UA: NEGATIVE
Protein,UA: NEGATIVE
RBC, UA: NEGATIVE
Specific Gravity, UA: 1.02 (ref 1.005–1.030)
Urobilinogen, Ur: 0.2 mg/dL (ref 0.2–1.0)
pH, UA: 5.5 (ref 5.0–7.5)

## 2019-04-19 LAB — CBC WITH DIFFERENTIAL/PLATELET
Basophils Absolute: 0 10*3/uL (ref 0.0–0.2)
Basos: 1 %
EOS (ABSOLUTE): 0.2 10*3/uL (ref 0.0–0.4)
Eos: 4 %
Hematocrit: 46.7 % (ref 37.5–51.0)
Hemoglobin: 15.1 g/dL (ref 13.0–17.7)
Immature Grans (Abs): 0 10*3/uL (ref 0.0–0.1)
Immature Granulocytes: 0 %
Lymphocytes Absolute: 1.8 10*3/uL (ref 0.7–3.1)
Lymphs: 31 %
MCH: 28 pg (ref 26.6–33.0)
MCHC: 32.3 g/dL (ref 31.5–35.7)
MCV: 87 fL (ref 79–97)
Monocytes Absolute: 0.6 10*3/uL (ref 0.1–0.9)
Monocytes: 11 %
Neutrophils Absolute: 3.1 10*3/uL (ref 1.4–7.0)
Neutrophils: 53 %
Platelets: 272 10*3/uL (ref 150–450)
RBC: 5.39 x10E6/uL (ref 4.14–5.80)
RDW: 14.6 % (ref 11.6–15.4)
WBC: 5.8 10*3/uL (ref 3.4–10.8)

## 2019-04-19 LAB — TSH: TSH: 4.74 u[IU]/mL — ABNORMAL HIGH (ref 0.450–4.500)

## 2019-04-19 LAB — COMPREHENSIVE METABOLIC PANEL
ALT: 25 IU/L (ref 0–44)
AST: 18 IU/L (ref 0–40)
Albumin/Globulin Ratio: 1.4 (ref 1.2–2.2)
Albumin: 4.2 g/dL (ref 3.8–4.9)
Alkaline Phosphatase: 59 IU/L (ref 39–117)
BUN/Creatinine Ratio: 14 (ref 9–20)
BUN: 11 mg/dL (ref 6–24)
Bilirubin Total: <0.2 mg/dL (ref 0.0–1.2)
CO2: 24 mmol/L (ref 20–29)
Calcium: 9.5 mg/dL (ref 8.7–10.2)
Chloride: 101 mmol/L (ref 96–106)
Creatinine, Ser: 0.8 mg/dL (ref 0.76–1.27)
GFR calc Af Amer: 118 mL/min/{1.73_m2} (ref >59)
GFR calc non Af Amer: 102 mL/min/{1.73_m2} (ref >59)
Globulin, Total: 3.1 g/dL (ref 1.5–4.5)
Glucose: 73 mg/dL (ref 65–99)
Potassium: 4.5 mmol/L (ref 3.5–5.2)
Sodium: 143 mmol/L (ref 134–144)
Total Protein: 7.3 g/dL (ref 6.0–8.5)

## 2019-04-19 LAB — LIPID PANEL W/O CHOL/HDL RATIO
Cholesterol, Total: 282 mg/dL — ABNORMAL HIGH (ref 100–199)
HDL: 42 mg/dL (ref >39)
LDL Chol Calc (NIH): 177 mg/dL — ABNORMAL HIGH (ref 0–99)
Triglycerides: 327 mg/dL — ABNORMAL HIGH (ref 0–149)
VLDL Cholesterol Cal: 63 mg/dL — ABNORMAL HIGH (ref 5–40)

## 2019-04-19 LAB — BAYER DCA HB A1C WAIVED: HB A1C (BAYER DCA - WAIVED): 5.7 % (ref <7.0)

## 2019-04-19 LAB — PSA: Prostate Specific Ag, Serum: 0.5 ng/mL (ref 0.0–4.0)

## 2019-04-21 ENCOUNTER — Ambulatory Visit: Payer: Managed Care, Other (non HMO) | Admitting: Nurse Practitioner

## 2019-04-23 ENCOUNTER — Other Ambulatory Visit: Payer: Self-pay | Admitting: Unknown Physician Specialty

## 2019-04-27 ENCOUNTER — Telehealth: Payer: Self-pay | Admitting: Family Medicine

## 2019-04-27 DIAGNOSIS — R7989 Other specified abnormal findings of blood chemistry: Secondary | ICD-10-CM

## 2019-04-27 DIAGNOSIS — E7800 Pure hypercholesterolemia, unspecified: Secondary | ICD-10-CM

## 2019-04-27 NOTE — Telephone Encounter (Signed)
Please let him know that his cholesterol got a lot worse. Please confirm that he's been taking his atorvastatin. If he is not- please start it again, it he is, please take 2 and we'll recheck it in a month. Also his thyroid was slightly off, so we'll recheck it in 1 month. Thanks!

## 2019-04-27 NOTE — Telephone Encounter (Signed)
Patient notified, lab appt scheduled for a month.

## 2019-05-18 ENCOUNTER — Other Ambulatory Visit: Payer: Self-pay

## 2019-05-18 ENCOUNTER — Encounter: Payer: Self-pay | Admitting: Nurse Practitioner

## 2019-05-18 ENCOUNTER — Ambulatory Visit: Payer: Managed Care, Other (non HMO) | Admitting: Nurse Practitioner

## 2019-05-18 VITALS — BP 127/78 | HR 78 | Temp 98.2°F | Ht 69.0 in | Wt 282.0 lb

## 2019-05-18 DIAGNOSIS — F322 Major depressive disorder, single episode, severe without psychotic features: Secondary | ICD-10-CM | POA: Diagnosis not present

## 2019-05-18 DIAGNOSIS — F5101 Primary insomnia: Secondary | ICD-10-CM

## 2019-05-18 DIAGNOSIS — G4733 Obstructive sleep apnea (adult) (pediatric): Secondary | ICD-10-CM

## 2019-05-18 MED ORDER — LORAZEPAM 0.5 MG PO TABS: 0.5000 mg | ORAL_TABLET | Freq: Every day | ORAL | 2 refills | Status: DC | PRN

## 2019-05-18 MED ORDER — ARMODAFINIL 200 MG PO TABS: ORAL_TABLET | ORAL | 2 refills | Status: DC

## 2019-05-18 NOTE — Patient Instructions (Signed)

## 2019-05-18 NOTE — Assessment & Plan Note (Signed)
Chronic, ongoing with use of Ativan as needed.  No maintenance medications at this time, refuses SSRI. Continue to recommend alternative options for depression/anxiety and consider GDR Ativan.  Refill sent #30 and 2 refill.  Return in 3 months. 

## 2019-05-18 NOTE — Assessment & Plan Note (Addendum)
Ongoing. Recommend continued use of Ativan as needed and use of CPAP 100% of the time.  Plan for reduction of Ativan in upcoming months and change to Trazodone which would benefit mood and sleep.

## 2019-05-18 NOTE — Assessment & Plan Note (Signed)
Recommend continued focus on health diet choices and regular physical activity (30 minutes 5 days a week). 

## 2019-05-18 NOTE — Assessment & Plan Note (Signed)
Chronic, ongoing.  Recommend using CPAP 100% of the time.  Refill on Armodafinil #30 with 2 refills.

## 2019-05-18 NOTE — Progress Notes (Signed)
BP 127/78    Pulse 78    Temp 98.2 F (36.8 C) (Oral)    Ht 5\' 9"  (1.753 m)    Wt 282 lb (127.9 kg)    SpO2 93%    BMI 41.64 kg/m    Subjective:    Patient ID: Patrick West, male    DOB: [birth year], 53 y.o.   MRN: [id number]  HPI: Patrick West is a 53 y.o. male  Chief Complaint  Patient presents with   Anxiety    f/u    Hyperlipidemia   Medication Refill    lorazepam. pt states that he has been out of lorazepam for a while     ANXIETY Continues on Ativan as needed, states uses 2-3 times a week.  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 Pt is aware of risks of benzo medication use to include increased sedation, respiratory suppression, falls, dependence and cardiovascular events. Pt would like to continue treatment as benefit determined to outweigh risk. Is not interested in alternate options at this time, such as SSRI.  Will continue to discuss this with him at visits and recommend alternative options, as has been on this regimen for some time on review, since 2018.  Did at one time take Celexa. Mood status: controlled Satisfied with current treatment?: yes Symptom severity: mild  Duration of current treatment : chronic Side effects: no Medication compliance: good compliance Psychotherapy/counseling: none Depressed mood: no Anxious mood: no Anhedonia: no Significant weight loss or gain: no Insomnia: none Fatigue: no Feelings of worthlessness or guilt: no Impaired concentration/indecisiveness: no Suicidal ideations: no Hopelessness: no Crying spells: no Depression screen [facility] 2/9 04/18/2019 11/24/2018 08/25/2018 02/17/2018 07/03/2017  Decreased Interest 0 2 1 2  0  Down, Depressed, Hopeless 1 1 1 2  0  PHQ - 2 Score 1 3 2 4  0  Altered sleeping 1 1 1 1 2   Tired, decreased energy 2 1 2 2 1   Change in appetite 1 1 0 1 2  Feeling bad or failure about yourself  0 1 2 0 2  Trouble concentrating 1 1 3  0 3  Moving slowly or  fidgety/restless 0 1 1 1 1   Suicidal thoughts 0 1 0 1 1  PHQ-9 Score 6 10 11 10 12   Difficult doing work/chores Somewhat difficult - Not difficult at all - -   GAD 7 : Generalized Anxiety Score 04/18/2019 11/24/2018 08/25/2018  Nervous, Anxious, on Edge 1 1 2   Control/stop worrying 1 1 2   Worry too much - different things 1 1 2   Trouble relaxing 1 2 2   Restless 1 1 2   Easily annoyed or irritable 2 1 3   Afraid - awful might happen 1 2 2   Total GAD 7 Score 8 9 15   Anxiety Difficulty Not difficult at all - Somewhat difficult    INSOMNIA & OSA Continues on Nuvigil for OSA to prevent "faling asleep at desk".  Uses CPAP about 85% of the time. Recommended he use this 100% of the time for full health benefit. Duration: chronic Satisfied with sleep quality: yes Difficulty falling asleep: no Difficulty staying asleep: no Waking a few hours after sleep onset: no Early morning awakenings: no Daytime hypersomnolence: no Wakes feeling refreshed: yes Good sleep hygiene: yes Apnea: no Snoring: yes Depressed/anxious mood: no Recent stress: no Restless legs/nocturnal leg cramps: no Chronic pain/arthritis: no History of sleep study: yes Treatments attempted: none   Relevant past medical, surgical,  family and social history reviewed and updated as indicated. Interim medical history since our last visit reviewed. Allergies and medications reviewed and updated.  Review of Systems  Constitutional: Negative for activity change, diaphoresis, fatigue and fever.  Respiratory: Negative for cough, chest tightness, shortness of breath and wheezing.   Cardiovascular: Negative for chest pain, palpitations and leg swelling.  Gastrointestinal: Negative for abdominal distention, abdominal pain, constipation, diarrhea, nausea and vomiting.  Neurological: Negative for dizziness, syncope, weakness, light-headedness, numbness and headaches.  Psychiatric/Behavioral: Positive for decreased concentration and sleep  disturbance. Negative for self-injury and suicidal ideas. The patient is nervous/anxious.     Per HPI unless specifically indicated above     Objective:    BP 127/78    Pulse 78    Temp 98.2 F (36.8 C) (Oral)    Ht 5\' 9"  (1.753 m)    Wt 282 lb (127.9 kg)    SpO2 93%    BMI 41.64 kg/m   Wt Readings from Last 3 Encounters:  05/18/19 282 lb (127.9 kg)  04/18/19 277 lb (125.6 kg)  11/24/18 286 lb (129.7 kg)    Physical Exam Vitals signs and nursing note reviewed.  Constitutional:      General: He is awake. He is not in acute distress.    Appearance: He is well-developed. He is morbidly obese. He is not ill-appearing.  HENT:     Head: Normocephalic and atraumatic.     Right Ear: Hearing normal. No drainage.     Left Ear: Hearing normal. No drainage.  Eyes:     General: Lids are normal.        Right eye: No discharge.        Left eye: No discharge.     Conjunctiva/sclera: Conjunctivae normal.     Pupils: Pupils are equal, round, and reactive to light.  Neck:     Musculoskeletal: Normal range of motion and neck supple.     Thyroid: No thyromegaly.     Vascular: No carotid bruit.  Cardiovascular:     Rate and Rhythm: Normal rate and regular rhythm.     Heart sounds: Normal heart sounds, S1 normal and S2 normal. No murmur. No gallop.   Pulmonary:     Effort: Pulmonary effort is normal. No accessory muscle usage or respiratory distress.     Breath sounds: Normal breath sounds.  Abdominal:     General: Bowel sounds are normal.     Palpations: Abdomen is soft.  Musculoskeletal: Normal range of motion.     Right lower leg: No edema.     Left lower leg: No edema.  Skin:    General: Skin is warm and dry.  Neurological:     Mental Status: He is alert and oriented to person, place, and time.  Psychiatric:        Attention and Perception: Attention normal.        Mood and Affect: Mood normal.        Behavior: Behavior normal. Behavior is cooperative.        Thought Content:  Thought content normal.        Judgment: Judgment normal.     Results for orders placed or performed in visit on 04/18/19  Bayer DCA Hb A1c Waived  Result Value Ref Range   HB A1C (BAYER DCA - WAIVED) 5.7 <7.0 %  Comprehensive metabolic panel  Result Value Ref Range   Glucose 73 65 - 99 mg/dL   BUN 11 6 - 24 mg/dL  Creatinine, Ser 0.80 0.76 - 1.27 mg/dL   GFR calc non Af Amer 102 >59 mL/min/1.73   GFR calc Af Amer 118 >59 mL/min/1.73   BUN/Creatinine Ratio 14 9 - 20   Sodium 143 134 - 144 mmol/L   Potassium 4.5 3.5 - 5.2 mmol/L   Chloride 101 96 - 106 mmol/L   CO2 24 20 - 29 mmol/L   Calcium 9.5 8.7 - 10.2 mg/dL   Total Protein 7.3 6.0 - 8.5 g/dL   Albumin 4.2 3.8 - 4.9 g/dL   Globulin, Total 3.1 1.5 - 4.5 g/dL   Albumin/Globulin Ratio 1.4 1.2 - 2.2   Bilirubin Total <0.2 0.0 - 1.2 mg/dL   Alkaline Phosphatase 59 39 - 117 IU/L   AST 18 0 - 40 IU/L   ALT 25 0 - 44 IU/L  CBC with Differential/Platelet  Result Value Ref Range   WBC 5.8 3.4 - 10.8 x10E3/uL   RBC 5.39 4.14 - 5.80 x10E6/uL   Hemoglobin 15.1 13.0 - 17.7 g/dL   Hematocrit 46.7 37.5 - 51.0 %   MCV 87 79 - 97 fL   MCH 28.0 26.6 - 33.0 pg   MCHC 32.3 31.5 - 35.7 g/dL   RDW 14.6 11.6 - 15.4 %   Platelets 272 150 - 450 x10E3/uL   Neutrophils 53 Not Estab. %   Lymphs 31 Not Estab. %   Monocytes 11 Not Estab. %   Eos 4 Not Estab. %   Basos 1 Not Estab. %   Neutrophils Absolute 3.1 1.4 - 7.0 x10E3/uL   Lymphocytes Absolute 1.8 0.7 - 3.1 x10E3/uL   Monocytes Absolute 0.6 0.1 - 0.9 x10E3/uL   EOS (ABSOLUTE) 0.2 0.0 - 0.4 x10E3/uL   Basophils Absolute 0.0 0.0 - 0.2 x10E3/uL   Immature Granulocytes 0 Not Estab. %   Immature Grans (Abs) 0.0 0.0 - 0.1 x10E3/uL  Lipid Panel w/o Chol/HDL Ratio  Result Value Ref Range   Cholesterol, Total 282 (H) 100 - 199 mg/dL   Triglycerides 327 (H) 0 - 149 mg/dL   HDL 42 >39 mg/dL   VLDL Cholesterol Cal 63 (H) 5 - 40 mg/dL   LDL Chol Calc (NIH) 177 (H) 0 - 99 mg/dL   Lipid  Comment: CANCELED   PSA  Result Value Ref Range   Prostate Specific Ag, Serum 0.5 0.0 - 4.0 ng/mL  TSH  Result Value Ref Range   TSH 4.740 (H) 0.450 - 4.500 uIU/mL  UA/M w/rflx Culture, Routine   Specimen: Blood   BLD  Result Value Ref Range   Specific Gravity, UA 1.020 1.005 - 1.030   pH, UA 5.5 5.0 - 7.5   Color, UA Yellow Yellow   Appearance Ur Clear Clear   Leukocytes,UA Negative Negative   Protein,UA Negative Negative/Trace   Glucose, UA Negative Negative   Ketones, UA Negative Negative   RBC, UA Negative Negative   Bilirubin, UA Negative Negative   Urobilinogen, Ur 0.2 0.2 - 1.0 mg/dL   Nitrite, UA Negative Negative      Assessment & Plan:   Problem List Items Addressed This Visit      Respiratory   Sleep apnea    Chronic, ongoing.  Recommend using CPAP 100% of the time.  Refill on Armodafinil #30 with 2 refills.        Other   Depression, major, single episode, severe ([place]) - Primary    Chronic, ongoing with use of Ativan as needed.  No maintenance medications at this time, refuses  SSRI. Continue to recommend alternative options for depression/anxiety and consider GDR Ativan.  Refill sent #30 and 2 refill.  Return in 3 months.      Relevant Medications   LORazepam (ATIVAN) 0.5 MG tablet   Insomnia    Ongoing. Recommend continued use of Ativan as needed and use of CPAP 100% of the time.  Plan for reduction of Ativan in upcoming months and change to Trazodone which would benefit mood and sleep.      Severe obesity (BMI >= 40) (HCC)    Recommend continued focus on health diet choices and regular physical activity (30 minutes 5 days a week).      Relevant Medications   Armodafinil 200 MG TABS      Controlled substance.  Checked data base and no other refills of controlled substances noted. Last Ativan fill 12/24/2018 and Armodafinil 05/08/19.  Follow up plan: Return in about 3 months (around 08/17/2019) for Mood follow-up and HLD.

## 2019-05-27 ENCOUNTER — Other Ambulatory Visit: Payer: Self-pay

## 2019-05-27 ENCOUNTER — Other Ambulatory Visit: Payer: Managed Care, Other (non HMO)

## 2019-05-27 DIAGNOSIS — E7800 Pure hypercholesterolemia, unspecified: Secondary | ICD-10-CM

## 2019-05-27 DIAGNOSIS — R7989 Other specified abnormal findings of blood chemistry: Secondary | ICD-10-CM

## 2019-05-28 LAB — COMPREHENSIVE METABOLIC PANEL
ALT: 38 IU/L (ref 0–44)
AST: 25 IU/L (ref 0–40)
Albumin/Globulin Ratio: 1.3 (ref 1.2–2.2)
Albumin: 4.3 g/dL (ref 3.8–4.9)
Alkaline Phosphatase: 60 IU/L (ref 39–117)
BUN/Creatinine Ratio: 17 (ref 9–20)
BUN: 14 mg/dL (ref 6–24)
Bilirubin Total: 0.2 mg/dL (ref 0.0–1.2)
CO2: 21 mmol/L (ref 20–29)
Calcium: 9.4 mg/dL (ref 8.7–10.2)
Chloride: 96 mmol/L (ref 96–106)
Creatinine, Ser: 0.84 mg/dL (ref 0.76–1.27)
GFR calc Af Amer: 116 mL/min/{1.73_m2} (ref >59)
GFR calc non Af Amer: 100 mL/min/{1.73_m2} (ref >59)
Globulin, Total: 3.3 g/dL (ref 1.5–4.5)
Glucose: 100 mg/dL — ABNORMAL HIGH (ref 65–99)
Potassium: 4.5 mmol/L (ref 3.5–5.2)
Sodium: 146 mmol/L — ABNORMAL HIGH (ref 134–144)
Total Protein: 7.6 g/dL (ref 6.0–8.5)

## 2019-05-28 LAB — TSH: TSH: 1.8 u[IU]/mL (ref 0.450–4.500)

## 2019-05-28 LAB — LIPID PANEL W/O CHOL/HDL RATIO
Cholesterol, Total: 179 mg/dL (ref 100–199)
HDL: 50 mg/dL (ref >39)
LDL Chol Calc (NIH): 102 mg/dL — ABNORMAL HIGH (ref 0–99)
Triglycerides: 152 mg/dL — ABNORMAL HIGH (ref 0–149)
VLDL Cholesterol Cal: 27 mg/dL (ref 5–40)

## 2019-08-05 ENCOUNTER — Other Ambulatory Visit: Payer: Self-pay | Admitting: Nurse Practitioner

## 2019-08-05 NOTE — Telephone Encounter (Signed)
Forwarding medication refill request to PCP for review. 

## 2019-08-30 ENCOUNTER — Ambulatory Visit: Payer: Managed Care, Other (non HMO) | Admitting: Nurse Practitioner

## 2019-08-30 ENCOUNTER — Other Ambulatory Visit: Payer: Self-pay

## 2019-08-30 ENCOUNTER — Encounter: Payer: Self-pay | Admitting: Nurse Practitioner

## 2019-08-30 VITALS — BP 130/79 | HR 94 | Temp 98.5°F

## 2019-08-30 DIAGNOSIS — F322 Major depressive disorder, single episode, severe without psychotic features: Secondary | ICD-10-CM

## 2019-08-30 DIAGNOSIS — K429 Umbilical hernia without obstruction or gangrene: Secondary | ICD-10-CM

## 2019-08-30 DIAGNOSIS — G4733 Obstructive sleep apnea (adult) (pediatric): Secondary | ICD-10-CM

## 2019-08-30 DIAGNOSIS — F5101 Primary insomnia: Secondary | ICD-10-CM

## 2019-08-30 DIAGNOSIS — E7800 Pure hypercholesterolemia, unspecified: Secondary | ICD-10-CM | POA: Diagnosis not present

## 2019-08-30 MED ORDER — LORAZEPAM 0.5 MG PO TABS: 0.5000 mg | ORAL_TABLET | Freq: Every day | ORAL | 0 refills | Status: DC | PRN

## 2019-08-30 MED ORDER — ARMODAFINIL 200 MG PO TABS: ORAL_TABLET | ORAL | 2 refills | Status: DC

## 2019-08-30 MED ORDER — SERTRALINE HCL 50 MG PO TABS: 50.0000 mg | ORAL_TABLET | Freq: Every day | ORAL | 3 refills | Status: DC

## 2019-08-30 NOTE — Assessment & Plan Note (Signed)
Recommend continued focus on health diet choices and regular physical activity (30 minutes 5 days a week). 

## 2019-08-30 NOTE — Progress Notes (Signed)
BP 130/79 (BP Location: Right Arm, Cuff Size: Normal)   Pulse 94   Temp 98.5 F (36.9 C) (Oral)   SpO2 93%    Subjective:    Patient ID: Patrick West, male    DOB: [birth year], 54 y.o.   MRN: [id number]  HPI: Patrick West is a 54 y.o. male  Chief Complaint  Patient presents with  . Follow-up    3 month for mood and HDL    ANXIETY Continues on Ativan as needed, on review of PMP his last fill of this was 05/18/2019 for 30 tablets, he endorses not using it as much due to concerns about risks. 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 Pt isaware of risks of benzomedication use to include increased sedation, respiratory suppression, falls, dependence and cardiovascular events. Ptwould like to continue treatment as benefit determined to outweigh risk.  Did at one time take Celexa.  Discussed at length with him trial of Zoloft which would benefit both depression and anxiety elements.  He would like to trial this.  Will continue Ativan, but instructed to continue to use minimally, while we get Zoloft to beneficial level. Mood status:controlled Satisfied with current treatment?:yes Symptom severity:mild Duration of current treatment :chronic Side effects:no Medication compliance:good compliance Psychotherapy/counseling:none Depressed mood:no Anxious mood:yes Anhedonia:no Significant weight loss or gain:no Insomnia:none Fatigue:no Feelings of worthlessness or guilt:no Impaired concentration/indecisiveness:no Suicidal ideations:no Hopelessness:no Crying spells:no Depression screen [facility] 2/9 08/30/2019 04/18/2019 11/24/2018 08/25/2018 02/17/2018  Decreased Interest 2 0 2 1 2   Down, Depressed, Hopeless 2 1 1 1 2   PHQ - 2 Score 4 1 3 2 4   Altered sleeping 2 1 1 1 1   Tired, decreased energy 3 2 1 2 2   Change in appetite 3 1 1  0 1  Feeling bad or failure about yourself  3 0 1 2 0  Trouble concentrating 2 1 1 3  0  Moving  slowly or fidgety/restless 3 0 1 1 1   Suicidal thoughts 0 0 1 0 1  PHQ-9 Score 20 6 10 11 10   Difficult doing work/chores Somewhat difficult Somewhat difficult - Not difficult at all -   Anhedonia: no Weight changes: no Insomnia: yes hard to fall asleep  Hypersomnia: no Fatigue/loss of energy: no Feelings of worthlessness: no Feelings of guilt: no Impaired concentration/indecisiveness: yes Suicidal ideations: no  Crying spells: no Recent Stressors/Life Changes: yes   Relationship problems: no   Family stress: no     Financial stress: no    Job stress: yes    Recent death/loss: no GAD 7 : Generalized Anxiety Score 08/30/2019 04/18/2019 11/24/2018 08/25/2018  Nervous, Anxious, on Edge 3 1 1 2   Control/stop worrying 3 1 1 2   Worry too much - different things 3 1 1 2   Trouble relaxing 2 1 2 2   Restless 1 1 1 2   Easily annoyed or irritable 1 2 1 3   Afraid - awful might happen 1 1 2 2   Total GAD 7 Score 14 8 9 15   Anxiety Difficulty Somewhat difficult Not difficult at all - Somewhat difficult    INSOMNIA & OSA Continues on Nuvigil for OSA to prevent "faling asleep at desk". Uses CPAP about 80% of the time. Recommended he use this 100% of the time for full health benefit. Duration:chronic Satisfied with sleep quality:yes Difficulty falling asleep:no Difficulty staying asleep:no Waking a few hours after sleep onset:no Early morning awakenings:no Daytime hypersomnolence:no Wakes feeling refreshed:yes Good sleep hygiene:yes  Apnea:no Snoring:yes Depressed/anxious mood:no Recent stress:no Restless legs/nocturnal leg cramps:no Chronic pain/arthritis:no History of sleep study:yes Treatments attempted:none  HYPERLIPIDEMIA Continues on Lipitor Hyperlipidemia status: good compliance Satisfied with current treatment?  yes Side effects:  no Medication compliance: good compliance Past cholesterol meds: atorvastain (lipitor) Supplements: none Aspirin:  no The  10-year ASCVD risk score Mikey Bussing DC Jr., et al., 2013) is: 6.1%   Values used to calculate the score:     Age: 7 years     Sex: Male     Is Non-Hispanic African American: Yes     Diabetic: No     Tobacco smoker: No     Systolic Blood Pressure: [id number] mmHg     Is BP treated: No     HDL Cholesterol: 50 mg/dL     Total Cholesterol: 179 mg/dL Chest pain:  no Coronary artery disease:  no Family history CAD:  no Family history early CAD:  no   HERNIA: He reports this is starting to come back out.  This weekend he noticed it the most.  He denies pain.  He had surgery at [facility] by Dr. Narda Bonds for complete intestinal obstruction due to hernia in 12/01/2018 and reports concerns over return of umbilical hernia.  He is wearing support as was instructed.  Has a history of Stage 1 Colon cancer 8 years ago. Fever: no Nausea: no Vomiting: no Weight loss: no Decreased appetite: no Diarrhea: no Constipation: no Blood in stool: no Heartburn: no Jaundice: no Rash: no Recurrent NSAID use: no  Relevant past medical, surgical, family and social history reviewed and updated as indicated. Interim medical history since our last visit reviewed. Allergies and medications reviewed and updated.  Review of Systems  Constitutional: Negative for activity change, diaphoresis, fatigue and fever.  Respiratory: Negative for cough, chest tightness, shortness of breath and wheezing.   Cardiovascular: Negative for chest pain, palpitations and leg swelling.  Gastrointestinal: Negative for abdominal distention, abdominal pain, constipation, diarrhea, nausea and vomiting.  Neurological: Negative.   Psychiatric/Behavioral: Positive for decreased concentration and sleep disturbance. Negative for self-injury and suicidal ideas. The patient is nervous/anxious.    Per HPI unless specifically indicated above     Objective:    BP 130/79 (BP Location: Right Arm, Cuff Size: Normal)   Pulse 94   Temp 98.5 F (36.9  C) (Oral)   SpO2 93%   Wt Readings from Last 3 Encounters:  05/18/19 282 lb (127.9 kg)  04/18/19 277 lb (125.6 kg)  11/24/18 286 lb (129.7 kg)    Physical Exam Vitals and nursing note reviewed.  Constitutional:      General: He is awake. He is not in acute distress.    Appearance: He is well-developed. He is morbidly obese. He is not ill-appearing.  HENT:     Head: Normocephalic and atraumatic.     Right Ear: Hearing normal. No drainage.     Left Ear: Hearing normal. No drainage.  Eyes:     General: Lids are normal.        Right eye: No discharge.        Left eye: No discharge.     Conjunctiva/sclera: Conjunctivae normal.     Pupils: Pupils are equal, round, and reactive to light.  Neck:     Vascular: No carotid bruit.  Cardiovascular:     Rate and Rhythm: Normal rate and regular rhythm.     Heart sounds: Normal heart sounds, S1 normal and S2 normal. No murmur. No gallop.  Pulmonary:     Effort: Pulmonary effort is normal. No accessory muscle usage or respiratory distress.     Breath sounds: Normal breath sounds.  Abdominal:     General: A surgical scar is present. Bowel sounds are normal. There is no distension.     Palpations: Abdomen is soft.     Tenderness: There is no abdominal tenderness.     Hernia: A hernia is present.  Musculoskeletal:        General: Normal range of motion.     Cervical back: Normal range of motion and neck supple.     Right lower leg: No edema.     Left lower leg: No edema.  Skin:    General: Skin is warm and dry.     Capillary Refill: Capillary refill takes less than 2 seconds.     Findings: No rash.  Neurological:     Mental Status: He is alert and oriented to person, place, and time.     Deep Tendon Reflexes: Reflexes are normal and symmetric.  Psychiatric:        Mood and Affect: Mood normal.        Behavior: Behavior normal. Behavior is cooperative.        Thought Content: Thought content normal.        Judgment: Judgment normal.       Results for orders placed or performed in visit on 05/27/19  TSH  Result Value Ref Range   TSH 1.800 0.450 - 4.500 uIU/mL  Lipid Panel w/o Chol/HDL Ratio  Result Value Ref Range   Cholesterol, Total 179 100 - 199 mg/dL   Triglycerides 152 (H) 0 - 149 mg/dL   HDL 50 >39 mg/dL   VLDL Cholesterol Cal 27 5 - 40 mg/dL   LDL Chol Calc (NIH) 102 (H) 0 - 99 mg/dL  Comprehensive metabolic panel  Result Value Ref Range   Glucose 100 (H) 65 - 99 mg/dL   BUN 14 6 - 24 mg/dL   Creatinine, Ser 0.84 0.76 - 1.27 mg/dL   GFR calc non Af Amer 100 >59 mL/min/1.73   GFR calc Af Amer 116 >59 mL/min/1.73   BUN/Creatinine Ratio 17 9 - 20   Sodium 146 (H) 134 - 144 mmol/L   Potassium 4.5 3.5 - 5.2 mmol/L   Chloride 96 96 - 106 mmol/L   CO2 21 20 - 29 mmol/L   Calcium 9.4 8.7 - 10.2 mg/dL   Total Protein 7.6 6.0 - 8.5 g/dL   Albumin 4.3 3.8 - 4.9 g/dL   Globulin, Total 3.3 1.5 - 4.5 g/dL   Albumin/Globulin Ratio 1.3 1.2 - 2.2   Bilirubin Total 0.2 0.0 - 1.2 mg/dL   Alkaline Phosphatase 60 39 - 117 IU/L   AST 25 0 - 40 IU/L   ALT 38 0 - 44 IU/L      Assessment & Plan:   Problem List Items Addressed This Visit      Respiratory   Sleep apnea    Chronic, ongoing.  Recommend using CPAP 100% of the time.  Refill on Armodafinil #30 with 2 refills.        Other   Depression, major, single episode, severe ([place]) - Primary    Chronic, ongoing with use of Ativan as needed.  Has minimally used this since September with 30 tablets lasting 4 months, discussed at length with him and will trial reduction of Ativan and initiate Zoloft at 50 MG daily. Refill sent #30 and 0 refill  of Ativan.  Zoloft script sent, educated on medication and risks/benefits.  He denies SI/HI.  Return in 4 weeks.      Relevant Medications   sertraline (ZOLOFT) 50 MG tablet   LORazepam (ATIVAN) 0.5 MG tablet   Insomnia    Chronic, ongoing.  Will trial reduction of Ativan, as has been minimally using since September, and  start Zoloft for mood.  Recommend use of CPAP 100% of the time.      Severe obesity (BMI >= 40) (HCC)    Recommend continued focus on health diet choices and regular physical activity (30 minutes 5 days a week).      Relevant Medications   Armodafinil 200 MG TABS   Hypercholesteremia    Chronic, ongoing.  Continue current medication regimen and adjust as needed.  Lipid panel and CMP.      Relevant Orders   Comprehensive metabolic panel   Lipid Panel w/o Chol/HDL Ratio out   Umbilical hernia    Will refer back to general surgery at [facility] for evaluation.      Relevant Orders   Ambulatory referral to General Surgery      Controlled substance.  Checked data base last Ativan fill was 05/18/2019 for 30 tablets and Armodafinil 200 MG tablet last fill was 07/19/2019.  Follow up plan: No follow-ups on file.

## 2019-08-30 NOTE — Assessment & Plan Note (Addendum)
Chronic, ongoing with use of Ativan as needed.  Has minimally used this since September with 30 tablets lasting 4 months, discussed at length with him and will trial reduction of Ativan and initiate Zoloft at 50 MG daily. Refill sent #30 and 0 refill of Ativan.  Zoloft script sent, educated on medication and risks/benefits.  He denies SI/HI.  Return in 4 weeks.

## 2019-08-30 NOTE — Assessment & Plan Note (Signed)
Chronic, ongoing.  Continue current medication regimen and adjust as needed.  Lipid panel and CMP.

## 2019-08-30 NOTE — Assessment & Plan Note (Signed)
Chronic, ongoing.  Will trial reduction of Ativan, as has been minimally using since September, and start Zoloft for mood.  Recommend use of CPAP 100% of the time.

## 2019-08-30 NOTE — Patient Instructions (Signed)

## 2019-08-30 NOTE — Assessment & Plan Note (Signed)
Will refer back to general surgery at [facility] for evaluation.

## 2019-08-30 NOTE — Assessment & Plan Note (Signed)
Chronic, ongoing.  Recommend using CPAP 100% of the time.  Refill on Armodafinil #30 with 2 refills.

## 2019-08-31 LAB — COMPREHENSIVE METABOLIC PANEL
ALT: 50 IU/L — ABNORMAL HIGH (ref 0–44)
AST: 36 IU/L (ref 0–40)
Albumin/Globulin Ratio: 1.3 (ref 1.2–2.2)
Albumin: 4.4 g/dL (ref 3.8–4.9)
Alkaline Phosphatase: 58 IU/L (ref 39–117)
BUN/Creatinine Ratio: 15 (ref 9–20)
BUN: 15 mg/dL (ref 6–24)
Bilirubin Total: 0.4 mg/dL (ref 0.0–1.2)
CO2: 25 mmol/L (ref 20–29)
Calcium: 9.9 mg/dL (ref 8.7–10.2)
Chloride: 99 mmol/L (ref 96–106)
Creatinine, Ser: 1 mg/dL (ref 0.76–1.27)
GFR calc Af Amer: 99 mL/min/{1.73_m2} (ref >59)
GFR calc non Af Amer: 86 mL/min/{1.73_m2} (ref >59)
Globulin, Total: 3.5 g/dL (ref 1.5–4.5)
Glucose: 103 mg/dL — ABNORMAL HIGH (ref 65–99)
Potassium: 4.2 mmol/L (ref 3.5–5.2)
Sodium: 140 mmol/L (ref 134–144)
Total Protein: 7.9 g/dL (ref 6.0–8.5)

## 2019-08-31 LAB — LIPID PANEL W/O CHOL/HDL RATIO
Cholesterol, Total: 228 mg/dL — ABNORMAL HIGH (ref 100–199)
HDL: 50 mg/dL (ref >39)
LDL Chol Calc (NIH): 134 mg/dL — ABNORMAL HIGH (ref 0–99)
Triglycerides: 247 mg/dL — ABNORMAL HIGH (ref 0–149)
VLDL Cholesterol Cal: 44 mg/dL — ABNORMAL HIGH (ref 5–40)

## 2019-08-31 NOTE — Progress Notes (Signed)
Contacted via MyChart  The 10-year ASCVD risk score Mikey Bussing DC Jr., et al., 2013) is: 6.5%   Values used to calculate the score:     Age: 54 years     Sex: Male     Is Non-Hispanic African American: Yes     Diabetic: No     Tobacco smoker: No     Systolic Blood Pressure: [id number] mmHg     Is BP treated: No     HDL Cholesterol: 50 mg/dL     Total Cholesterol: 228 mg/dL

## 2019-09-27 ENCOUNTER — Telehealth: Payer: Managed Care, Other (non HMO) | Admitting: Nurse Practitioner

## 2019-10-31 MED ORDER — IPRATROPIUM-ALBUTEROL 0.5-2.5 (3) MG/3ML IN SOLN
3.00 | RESPIRATORY_TRACT | Status: DC
Start: 2019-10-31 — End: 2019-10-31

## 2019-10-31 MED ORDER — BISACODYL 10 MG RE SUPP
10.00 | RECTAL | Status: DC
Start: ? — End: 2019-10-31

## 2019-10-31 MED ORDER — POLYETHYLENE GLYCOL 3350 17 GM/SCOOP PO POWD
17.00 | ORAL | Status: DC
Start: 2019-11-01 — End: 2019-10-31

## 2019-10-31 MED ORDER — PHENOL 1.4 % MT LIQD
2.00 | OROMUCOSAL | Status: DC
Start: ? — End: 2019-10-31

## 2019-10-31 MED ORDER — GENERIC EXTERNAL MEDICATION
Status: DC
Start: ? — End: 2019-10-31

## 2019-10-31 MED ORDER — HEPARIN SODIUM (PORCINE) 5000 UNIT/ML IJ SOLN
5000.00 | INTRAMUSCULAR | Status: DC
Start: 2019-10-31 — End: 2019-10-31

## 2019-10-31 MED ORDER — CEFAZOLIN SODIUM-DEXTROSE 2-4 GM/100ML-% IV SOLN
2.00 | INTRAVENOUS | Status: DC
Start: 2019-10-31 — End: 2019-10-31

## 2019-10-31 MED ORDER — INFLUENZA VAC SPLIT QUAD 0.5 ML IM SUSY
0.50 | PREFILLED_SYRINGE | INTRAMUSCULAR | Status: DC
Start: ? — End: 2019-10-31

## 2019-11-01 ENCOUNTER — Other Ambulatory Visit: Payer: Self-pay

## 2019-11-01 ENCOUNTER — Encounter: Payer: Self-pay | Admitting: Nurse Practitioner

## 2019-11-01 ENCOUNTER — Ambulatory Visit: Payer: Managed Care, Other (non HMO) | Admitting: Nurse Practitioner

## 2019-11-01 VITALS — BP 133/83 | HR 90 | Temp 99.1°F

## 2019-11-01 DIAGNOSIS — G4733 Obstructive sleep apnea (adult) (pediatric): Secondary | ICD-10-CM

## 2019-11-01 NOTE — Assessment & Plan Note (Addendum)
Chronic, ongoing.  Referral to obtain sleep study and start CPAP placed.  Continue O2 by nasal cannula at this time until surgical follow-up.

## 2019-11-01 NOTE — Patient Instructions (Signed)

## 2019-11-01 NOTE — Progress Notes (Signed)
BP 133/83   Pulse 90   Temp 99.1 F (37.3 C) (Oral)   SpO2 96% Comment: on 2L of O2   Subjective:    Patient ID: Patrick West, male    DOB: Jun 09, 1966, 54 y.o.   MRN: [id number]  HPI: Patrick West is a 54 y.o. male  Chief Complaint  Patient presents with  . Follow-up    pt states he was told by his surgeon to discuss sleep apnea and a CPAP asap    SLEEP APNEA Had ventral hernia repair on Thursday. He is service connected at [place], but does not have PCP there.  Had referral for sleep study in 2017, but did not attend.  Reports surgeon states he needs sleep study ASAP, as when was sedated for surgery his O2 sats dropped.  Currently on O2 by [place].  Did have sleep study years, was told he had sleep apnea.  He did wear a CPAP back in 2010. Sleep apnea status: fluctuating Duration: chronic Satisfied with current treatment?:  yes CPAP use:  no Last sleep study:  Around 2010 per patient Wakes feeling refreshed:  no Daytime hypersomnolence:  yes Fatigue:  yes Insomnia:  yes Good sleep hygiene:  yes Difficulty falling asleep:  yes Difficulty staying asleep:  yes Snoring bothers bed partner:  yes Observed apnea by bed partner: unknown Obesity:  yes Hypertension: no  Pulmonary hypertension:  no Coronary artery disease:  no  Relevant past medical, surgical, family and social history reviewed and updated as indicated. Interim medical history since our last visit reviewed. Allergies and medications reviewed and updated.  Review of Systems  Constitutional: Negative for activity change, diaphoresis, fatigue and fever.  Respiratory: Negative for cough, chest tightness, shortness of breath and wheezing.   Cardiovascular: Negative for chest pain, palpitations and leg swelling.  Gastrointestinal: Negative.   Psychiatric/Behavioral: Negative.     Per HPI unless specifically indicated above     Objective:    BP 133/83   Pulse 90   Temp 99.1 F (37.3 C) (Oral)   SpO2 96% Comment:  on 2L of O2  Wt Readings from Last 3 Encounters:  05/18/19 282 lb (127.9 kg)  04/18/19 277 lb (125.6 kg)  11/24/18 286 lb (129.7 kg)    Physical Exam Vitals and nursing note reviewed.  Constitutional:      General: He is awake. He is not in acute distress.    Appearance: He is well-developed. He is morbidly obese. He is not ill-appearing.  HENT:     Head: Normocephalic and atraumatic.     Right Ear: Hearing normal. No drainage.     Left Ear: Hearing normal. No drainage.  Eyes:     General: Lids are normal.        Right eye: No discharge.        Left eye: No discharge.     Conjunctiva/sclera: Conjunctivae normal.     Pupils: Pupils are equal, round, and reactive to light.  Neck:     Vascular: No carotid bruit.  Cardiovascular:     Rate and Rhythm: Normal rate and regular rhythm.     Heart sounds: Normal heart sounds, S1 normal and S2 normal. No murmur. No gallop.   Pulmonary:     Effort: Pulmonary effort is normal. No accessory muscle usage or respiratory distress.     Breath sounds: Normal breath sounds.     Comments: Currently with O2 on at 2L by [place]. Abdominal:     General:  A surgical scar is present. Bowel sounds are normal. There is no distension.     Palpations: Abdomen is soft.     Tenderness: There is no abdominal tenderness.     Comments: Surgical wound covered.  Musculoskeletal:        General: Normal range of motion.     Cervical back: Normal range of motion and neck supple.     Right lower leg: No edema.     Left lower leg: No edema.  Skin:    General: Skin is warm and dry.     Capillary Refill: Capillary refill takes less than 2 seconds.     Findings: No rash.  Neurological:     Mental Status: He is alert and oriented to person, place, and time.     Deep Tendon Reflexes: Reflexes are normal and symmetric.  Psychiatric:        Attention and Perception: Attention normal.        Mood and Affect: Mood normal.        Behavior: Behavior normal. Behavior is  cooperative.     Results for orders placed or performed in visit on 08/30/19  Comprehensive metabolic panel  Result Value Ref Range   Glucose 103 (H) 65 - 99 mg/dL   BUN 15 6 - 24 mg/dL   Creatinine, Ser 1.00 0.76 - 1.27 mg/dL   GFR calc non Af Amer 86 >59 mL/min/1.73   GFR calc Af Amer 99 >59 mL/min/1.73   BUN/Creatinine Ratio 15 9 - 20   Sodium 140 134 - 144 mmol/L   Potassium 4.2 3.5 - 5.2 mmol/L   Chloride 99 96 - 106 mmol/L   CO2 25 20 - 29 mmol/L   Calcium 9.9 8.7 - 10.2 mg/dL   Total Protein 7.9 6.0 - 8.5 g/dL   Albumin 4.4 3.8 - 4.9 g/dL   Globulin, Total 3.5 1.5 - 4.5 g/dL   Albumin/Globulin Ratio 1.3 1.2 - 2.2   Bilirubin Total 0.4 0.0 - 1.2 mg/dL   Alkaline Phosphatase 58 39 - 117 IU/L   AST 36 0 - 40 IU/L   ALT 50 (H) 0 - 44 IU/L  Lipid Panel w/o Chol/HDL Ratio out  Result Value Ref Range   Cholesterol, Total 228 (H) 100 - 199 mg/dL   Triglycerides 247 (H) 0 - 149 mg/dL   HDL 50 >39 mg/dL   VLDL Cholesterol Cal 44 (H) 5 - 40 mg/dL   LDL Chol Calc (NIH) 134 (H) 0 - 99 mg/dL      Assessment & Plan:   Problem List Items Addressed This Visit      Respiratory   Sleep apnea - Primary    Chronic, ongoing.  Referral to obtain sleep study and start CPAP placed.  Continue O2 by nasal cannula at this time until surgical follow-up.      Relevant Orders   Ambulatory referral to Sleep Studies       Follow up plan: Return in about 6 weeks (around 12/13/2019) for Mood.

## 2019-11-09 ENCOUNTER — Encounter: Payer: Self-pay | Admitting: Nurse Practitioner

## 2019-11-09 ENCOUNTER — Ambulatory Visit: Payer: Managed Care, Other (non HMO) | Admitting: Nurse Practitioner

## 2019-11-09 ENCOUNTER — Other Ambulatory Visit: Payer: Self-pay

## 2019-11-09 DIAGNOSIS — Z8719 Personal history of other diseases of the digestive system: Secondary | ICD-10-CM | POA: Diagnosis not present

## 2019-11-09 DIAGNOSIS — Z9889 Other specified postprocedural states: Secondary | ICD-10-CM

## 2019-11-09 DIAGNOSIS — G4733 Obstructive sleep apnea (adult) (pediatric): Secondary | ICD-10-CM

## 2019-11-09 MED ORDER — ATORVASTATIN CALCIUM 40 MG PO TABS: 40.0000 mg | ORAL_TABLET | Freq: Every day | ORAL | 3 refills | Status: DC

## 2019-11-09 NOTE — Assessment & Plan Note (Deleted)
Recent surgical repair and reports overall he is feeling better.  Continue to collaborate with surgical team as needed.

## 2019-11-09 NOTE — Assessment & Plan Note (Signed)
Ventral hernia repair.  Reports feeling better.  Continue to collaborate with surgical team as needed.  Return for any worsening symptoms.

## 2019-11-09 NOTE — Progress Notes (Signed)
BP 127/83 (BP Location: Left Arm)   Pulse 90 Comment: apical  Temp 98 F (36.7 C) (Oral)   SpO2 96%    Subjective:    Patient ID: Patrick West, male    DOB: Dec 19, 1965, 54 y.o.   MRN: [id number]  HPI: Patrick West is a 54 y.o. male  Chief Complaint  Patient presents with  . Follow-up   SLEEP APNEA Had ventral hernia repair on Thursday. He is service connected at [place], but does not have PCP there.  Had referral for sleep study in 2017, but did not attend.  Referral placed last visit for sleep study and he is scheduled to have virtual with them on Friday morning.  He did wear a CPAP back in 2010, but has not worn for a couple years.  After recent surgery was sent home with O2 due to failing 6 minute walk test in hospital.  Has now been without O2 since yesterday after follow-up with his surgeon. Reports he can drive now without issue.  He denies SOB, coughing, CP.  Sleeps on one pillow at night.   Sleep apnea status: fluctuating Duration: chronic Satisfied with current treatment?:  yes CPAP use:  no Last sleep study:  Around 2010 per patient Wakes feeling refreshed:  no Daytime hypersomnolence:  yes Fatigue:  yes Insomnia:  yes Good sleep hygiene:  yes Difficulty falling asleep:  yes Difficulty staying asleep:  yes Snoring bothers bed partner:  yes Observed apnea by bed partner: unknown Obesity:  yes Hypertension: no  Pulmonary hypertension:  no Coronary artery disease:  no  Relevant past medical, surgical, family and social history reviewed and updated as indicated. Interim medical history since our last visit reviewed. Allergies and medications reviewed and updated.  Review of Systems  Constitutional: Negative for activity change, diaphoresis, fatigue and fever.  Respiratory: Negative for cough, chest tightness, shortness of breath and wheezing.   Cardiovascular: Negative for chest pain, palpitations and leg swelling.  Gastrointestinal: Negative.    Psychiatric/Behavioral: Negative.     Per HPI unless specifically indicated above     Objective:    BP 127/83 (BP Location: Left Arm)   Pulse 90 Comment: apical  Temp 98 F (36.7 C) (Oral)   SpO2 96%   Wt Readings from Last 3 Encounters:  05/18/19 282 lb (127.9 kg)  04/18/19 277 lb (125.6 kg)  11/24/18 286 lb (129.7 kg)    Physical Exam Vitals and nursing note reviewed.  Constitutional:      General: He is awake. He is not in acute distress.    Appearance: He is well-developed. He is morbidly obese. He is not ill-appearing.  HENT:     Head: Normocephalic and atraumatic.     Right Ear: Hearing normal. No drainage.     Left Ear: Hearing normal. No drainage.  Eyes:     General: Lids are normal.        Right eye: No discharge.        Left eye: No discharge.     Conjunctiva/sclera: Conjunctivae normal.     Pupils: Pupils are equal, round, and reactive to light.  Neck:     Vascular: No carotid bruit.  Cardiovascular:     Rate and Rhythm: Normal rate and regular rhythm.     Heart sounds: Normal heart sounds, S1 normal and S2 normal. No murmur. No gallop.   Pulmonary:     Effort: Pulmonary effort is normal. No accessory muscle usage or respiratory distress.  Breath sounds: Normal breath sounds.     Comments: Walked hallways with patient -- O2 sat stable throughout > 1 minute period at 95% and no SOB noted. Abdominal:     General: Bowel sounds are normal. There is no distension.     Palpations: Abdomen is soft.     Tenderness: There is no abdominal tenderness.     Comments: Surgical wound covered.  Musculoskeletal:        General: Normal range of motion.     Cervical back: Normal range of motion and neck supple.     Right lower leg: No edema.     Left lower leg: No edema.  Skin:    General: Skin is warm and dry.     Capillary Refill: Capillary refill takes less than 2 seconds.     Findings: No rash.  Neurological:     Mental Status: He is alert and oriented to  person, place, and time.     Deep Tendon Reflexes: Reflexes are normal and symmetric.  Psychiatric:        Attention and Perception: Attention normal.        Mood and Affect: Mood normal.        Speech: Speech normal.        Behavior: Behavior normal. Behavior is cooperative.     Results for orders placed or performed in visit on 08/30/19  Comprehensive metabolic panel  Result Value Ref Range   Glucose 103 (H) 65 - 99 mg/dL   BUN 15 6 - 24 mg/dL   Creatinine, Ser 1.00 0.76 - 1.27 mg/dL   GFR calc non Af Amer 86 >59 mL/min/1.73   GFR calc Af Amer 99 >59 mL/min/1.73   BUN/Creatinine Ratio 15 9 - 20   Sodium 140 134 - 144 mmol/L   Potassium 4.2 3.5 - 5.2 mmol/L   Chloride 99 96 - 106 mmol/L   CO2 25 20 - 29 mmol/L   Calcium 9.9 8.7 - 10.2 mg/dL   Total Protein 7.9 6.0 - 8.5 g/dL   Albumin 4.4 3.8 - 4.9 g/dL   Globulin, Total 3.5 1.5 - 4.5 g/dL   Albumin/Globulin Ratio 1.3 1.2 - 2.2   Bilirubin Total 0.4 0.0 - 1.2 mg/dL   Alkaline Phosphatase 58 39 - 117 IU/L   AST 36 0 - 40 IU/L   ALT 50 (H) 0 - 44 IU/L  Lipid Panel w/o Chol/HDL Ratio out  Result Value Ref Range   Cholesterol, Total 228 (H) 100 - 199 mg/dL   Triglycerides 247 (H) 0 - 149 mg/dL   HDL 50 >39 mg/dL   VLDL Cholesterol Cal 44 (H) 5 - 40 mg/dL   LDL Chol Calc (NIH) 134 (H) 0 - 99 mg/dL      Assessment & Plan:   Problem List Items Addressed This Visit      Respiratory   Sleep apnea    Chronic, ongoing.  Referral to obtain sleep study placed last visit, scheduled for virtual visit with sleep study team on Friday per his report.  At this time is maintaining O2 sats >95% on [place], do no need to restart O2 at this time, but would highly benefit from consistent CPAP use due to risk factors.        Other   Severe obesity (BMI >= 40) (HCC) - Primary    Recommend continued focus on healthy diet choices and regular physical activity (30 minutes 5 days a week).  Discussed benefit of modest weight loss  on overall health.        History of hernia repair    Ventral hernia repair.  Reports feeling better.  Continue to collaborate with surgical team as needed.  Return for any worsening symptoms.          Follow up plan: Return in about 4 weeks (around 12/07/2019) for Mood .

## 2019-11-09 NOTE — Patient Instructions (Signed)

## 2019-11-09 NOTE — Assessment & Plan Note (Signed)
Chronic, ongoing.  Referral to obtain sleep study placed last visit, scheduled for virtual visit with sleep study team on Friday per his report.  At this time is maintaining O2 sats >95% on RA, do no need to restart O2 at this time, but would highly benefit from consistent CPAP use due to risk factors.

## 2019-11-09 NOTE — Assessment & Plan Note (Signed)
Recommend continued focus on healthy diet choices and regular physical activity (30 minutes 5 days a week).  Discussed benefit of modest weight loss on overall health.

## 2019-11-18 ENCOUNTER — Other Ambulatory Visit: Payer: Self-pay | Admitting: Internal Medicine

## 2019-11-18 DIAGNOSIS — G4719 Other hypersomnia: Secondary | ICD-10-CM

## 2019-11-23 ENCOUNTER — Other Ambulatory Visit: Payer: Self-pay | Admitting: Internal Medicine

## 2019-12-09 ENCOUNTER — Other Ambulatory Visit
Admission: RE | Admit: 2019-12-09 | Discharge: 2019-12-09 | Disposition: A | Discharge status: Home / Self Care | Payer: BC Managed Care – PPO | Source: Ambulatory Visit | Attending: Unknown Physician Specialty | Admitting: Unknown Physician Specialty

## 2019-12-09 DIAGNOSIS — Z01812 Encounter for preprocedural laboratory examination: Secondary | ICD-10-CM | POA: Insufficient documentation

## 2019-12-09 DIAGNOSIS — Z20822 Contact with and (suspected) exposure to covid-19: Secondary | ICD-10-CM | POA: Diagnosis not present

## 2019-12-09 LAB — SARS CORONAVIRUS 2 (TAT 6-24 HRS): SARS Coronavirus 2: NEGATIVE

## 2019-12-13 ENCOUNTER — Ambulatory Visit (INDEPENDENT_AMBULATORY_CARE_PROVIDER_SITE_OTHER): Payer: BC Managed Care – PPO | Admitting: Nurse Practitioner

## 2019-12-13 ENCOUNTER — Ambulatory Visit: Payer: BC Managed Care – PPO | Attending: Internal Medicine

## 2019-12-13 ENCOUNTER — Encounter: Payer: Self-pay | Admitting: Nurse Practitioner

## 2019-12-13 ENCOUNTER — Other Ambulatory Visit: Payer: Self-pay

## 2019-12-13 VITALS — BP 128/84 | HR 78 | Temp 98.5°F | Wt 296.0 lb

## 2019-12-13 DIAGNOSIS — R0902 Hypoxemia: Secondary | ICD-10-CM | POA: Insufficient documentation

## 2019-12-13 DIAGNOSIS — F322 Major depressive disorder, single episode, severe without psychotic features: Secondary | ICD-10-CM | POA: Diagnosis not present

## 2019-12-13 DIAGNOSIS — E7800 Pure hypercholesterolemia, unspecified: Secondary | ICD-10-CM

## 2019-12-13 DIAGNOSIS — G4733 Obstructive sleep apnea (adult) (pediatric): Secondary | ICD-10-CM | POA: Diagnosis not present

## 2019-12-13 DIAGNOSIS — F5101 Primary insomnia: Secondary | ICD-10-CM | POA: Diagnosis not present

## 2019-12-13 MED ORDER — ARMODAFINIL 200 MG PO TABS: ORAL_TABLET | ORAL | 2 refills | Status: DC

## 2019-12-13 MED ORDER — LORAZEPAM 0.5 MG PO TABS: ORAL_TABLET | ORAL | 1 refills | Status: DC

## 2019-12-13 NOTE — Assessment & Plan Note (Signed)
Chronic, ongoing with use of Ativan as needed.  Has minimally used this since February fill and is working on cutting back since improved mood with Zoloft.  Continue Zoloft 50 MG and consider increase at future visits if change in mood and continue Ativan as needed.  Recommended he continue to cut back on use of this with goal to discontinue. Refill sent #30 and 1 refill of Ativan.  He denies SI/HI.  Return in 3 months.

## 2019-12-13 NOTE — Patient Instructions (Signed)

## 2019-12-13 NOTE — Progress Notes (Signed)
BP 128/84 (BP Location: Left Arm)   Pulse 78   Temp 98.5 F (36.9 C) (Oral)   Wt 296 lb (134.3 kg)   SpO2 92%   BMI 43.71 kg/m    Subjective:    Patient ID: Patrick West, male    DOB: [birth year], 54 y.o.   MRN: [id number]  HPI: SAVANT VUOLO is a 54 y.o. male  Chief Complaint  Patient presents with  . Mood   ANXIETY Continues on Ativan as needed, on review of PMP his last fill of this was 10/16/2019 for 30 tablets, states only one or two left. 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 Pt isaware of risks of benzomedication use to include increased sedation, respiratory suppression, falls, dependence and cardiovascular events. Ptwould like to continue treatment as benefit determined to outweigh risk.  Did at one time take Celexa.  Started on Zoloft in January and reports benefit with this and has been able to cut back some on Ativan, how with 30 pills lasting him 2 months.  Wishes to stay at current dose Zoloft and continue to work on cutting back on Ativan on own. Mood status:controlled Satisfied with current treatment?:yes Symptom severity:mild Duration of current treatment :chronic Side effects:no Medication compliance:good compliance Psychotherapy/counseling:none Depressed mood:no Anxious mood:yes Anhedonia:no Significant weight loss or gain:no Insomnia:none Fatigue:no Feelings of worthlessness or guilt:no Impaired concentration/indecisiveness:no Suicidal ideations:no Hopelessness:no Crying spells:no Depression screen [facility] 2/9 12/13/2019 08/30/2019 04/18/2019 11/24/2018 08/25/2018  Decreased Interest 1 2 0 2 1  Down, Depressed, Hopeless 1 2 1 1 1   PHQ - 2 Score 2 4 1 3 2   Altered sleeping 2 2 1 1 1   Tired, decreased energy 2 3 2 1 2   Change in appetite 1 3 1 1  0  Feeling bad or failure about yourself  1 3 0 1 2  Trouble concentrating 0 2 1 1 3   Moving slowly or fidgety/restless 0 3 0 1 1  Suicidal  thoughts 0 0 0 1 0  PHQ-9 Score 8 20 6 10 11   Difficult doing work/chores Somewhat difficult Somewhat difficult Somewhat difficult - Not difficult at all  Some recent data might be hidden   Anhedonia: no Weight changes: no Insomnia: yes hard to fall asleep  Hypersomnia: no Fatigue/loss of energy: no Feelings of worthlessness: no Feelings of guilt: no Impaired concentration/indecisiveness: yes Suicidal ideations: no  Crying spells: no Recent Stressors/Life Changes: yes   Relationship problems: no   Family stress: no     Financial stress: no    Job stress: yes    Recent death/loss: no GAD 7 : Generalized Anxiety Score 12/13/2019 08/30/2019 04/18/2019 11/24/2018  Nervous, Anxious, on Edge 0 3 1 1   Control/stop worrying 1 3 1 1   Worry too much - different things 0 3 1 1   Trouble relaxing 0 2 1 2   Restless 1 1 1 1   Easily annoyed or irritable 0 1 2 1   Afraid - awful might happen 0 1 1 2   Total GAD 7 Score 2 14 8 9   Anxiety Difficulty Not difficult at all Somewhat difficult Not difficult at all -    INSOMNIA & OSA Continues on Nuvigil for OSA to prevent "faling asleep at desk", last fill 11/15/19 for #30 pills on PMP review. Not using CPAP as is not working correctly -- has sleep study today.  Is using O2 at night right now -- 2L [place].   Duration:chronic Satisfied with  sleep quality:yes Difficulty falling asleep:no Difficulty staying asleep:no Waking a few hours after sleep onset:no Early morning awakenings:no Daytime hypersomnolence:no Wakes feeling refreshed:yes Good sleep hygiene:yes Apnea:no Snoring:yes Depressed/anxious mood:no Recent stress:no Restless legs/nocturnal leg cramps:no Chronic pain/arthritis:no History of sleep study:yes Treatments attempted:none  HYPERLIPIDEMIA Continues on Lipitor.  Last levels elevated, was no fasting, but is fasting today. Hyperlipidemia status: good compliance Satisfied with current treatment?  yes Side effects:   no Medication compliance: good compliance Past cholesterol meds: atorvastain (lipitor) Supplements: none Aspirin:  no The 10-year ASCVD risk score Mikey Bussing DC Jr., et al., 2013) is: 6.3%   Values used to calculate the score:     Age: 23 years     Sex: Male     Is Non-Hispanic African American: Yes     Diabetic: No     Tobacco smoker: No     Systolic Blood Pressure: 0000000 mmHg     Is BP treated: No     HDL Cholesterol: 50 mg/dL     Total Cholesterol: 228 mg/dL Chest pain:  no Coronary artery disease:  no Family history CAD:  no Family history early CAD:  no   Relevant past medical, surgical, family and social history reviewed and updated as indicated. Interim medical history since our last visit reviewed. Allergies and medications reviewed and updated.  Review of Systems  Constitutional: Negative for activity change, diaphoresis, fatigue and fever.  Respiratory: Negative for cough, chest tightness, shortness of breath and wheezing.   Cardiovascular: Negative for chest pain, palpitations and leg swelling.  Gastrointestinal: Negative for abdominal distention, abdominal pain, constipation, diarrhea, nausea and vomiting.  Neurological: Negative.   Psychiatric/Behavioral: Positive for decreased concentration and sleep disturbance. Negative for self-injury and suicidal ideas. The patient is nervous/anxious.    Per HPI unless specifically indicated above     Objective:    BP 128/84 (BP Location: Left Arm)   Pulse 78   Temp 98.5 F (36.9 C) (Oral)   Wt 296 lb (134.3 kg)   SpO2 92%   BMI 43.71 kg/m   Wt Readings from Last 3 Encounters:  12/13/19 296 lb (134.3 kg)  05/18/19 282 lb (127.9 kg)  04/18/19 277 lb (125.6 kg)    Physical Exam Vitals and nursing note reviewed.  Constitutional:      General: He is awake. He is not in acute distress.    Appearance: He is well-developed. He is morbidly obese. He is not ill-appearing.  HENT:     Head: Normocephalic and atraumatic.      Right Ear: Hearing normal. No drainage.     Left Ear: Hearing normal. No drainage.  Eyes:     General: Lids are normal.        Right eye: No discharge.        Left eye: No discharge.     Conjunctiva/sclera: Conjunctivae normal.     Pupils: Pupils are equal, round, and reactive to light.  Neck:     Vascular: No carotid bruit.  Cardiovascular:     Rate and Rhythm: Normal rate and regular rhythm.     Heart sounds: Normal heart sounds, S1 normal and S2 normal. No murmur. No gallop.   Pulmonary:     Effort: Pulmonary effort is normal. No accessory muscle usage or respiratory distress.     Breath sounds: Normal breath sounds.  Abdominal:     General: A surgical scar is present. Bowel sounds are normal. There is no distension.     Palpations: Abdomen is soft.  Tenderness: There is no abdominal tenderness.  Musculoskeletal:        General: Normal range of motion.     Cervical back: Normal range of motion and neck supple.     Right lower leg: No edema.     Left lower leg: No edema.  Skin:    General: Skin is warm and dry.     Capillary Refill: Capillary refill takes less than 2 seconds.     Findings: No rash.  Neurological:     Mental Status: He is alert and oriented to person, place, and time.     Deep Tendon Reflexes: Reflexes are normal and symmetric.  Psychiatric:        Attention and Perception: Attention normal.        Mood and Affect: Mood normal.        Speech: Speech normal.        Behavior: Behavior normal. Behavior is cooperative.        Thought Content: Thought content normal.     Results for orders placed or performed during the hospital encounter of 12/09/19  SARS CORONAVIRUS 2 (TAT 6-24 HRS) Nasopharyngeal Nasopharyngeal Swab   Specimen: Nasopharyngeal Swab  Result Value Ref Range   SARS Coronavirus 2 NEGATIVE NEGATIVE      Assessment & Plan:   Problem List Items Addressed This Visit      Respiratory   Sleep apnea    Chronic, ongoing.  Has repeat sleep  study today due to broken equipment, recommend he use CPAP 100% of the time once available.  At present continue O2 at night until CPAP equipment in.  Refill on Armodafinil #30 with 2 refills.      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  Has minimally used this since February fill and is working on cutting back since improved mood with Zoloft.  Continue Zoloft 50 MG and consider increase at future visits if change in mood and continue Ativan as needed.  Recommended he continue to cut back on use of this with goal to discontinue. Refill sent #30 and 1 refill of Ativan.  He denies SI/HI.  Return in 3 months.      Relevant Orders   [id number] 11+Oxyco+Alc+Crt-Bund   Severe obesity (BMI >= 40) (HCC)    Recommended eating smaller high protein, low fat meals more frequently and exercising 30 mins a day 5 times a week with a goal of 10-15lb weight loss in the next 3 months. Patient voiced their understanding and motivation to adhere to these recommendations.       Hypercholesteremia    Chronic, ongoing.  Continue current medication regimen and adjust as needed.  Lipid panel today.      Relevant Orders   Lipid Panel w/o Chol/HDL Ratio      Controlled substance.  Checked data base.  Follow up plan: Return in about 3 months (around 03/13/2020) for MOOD and OSA.

## 2019-12-13 NOTE — Assessment & Plan Note (Signed)
Chronic, ongoing.  Continue current medication regimen and adjust as needed. Lipid panel today. 

## 2019-12-13 NOTE — Assessment & Plan Note (Signed)
Chronic, ongoing.  Has repeat sleep study today due to broken equipment, recommend he use CPAP 100% of the time once available.  At present continue O2 at night until CPAP equipment in.  Refill on Armodafinil #30 with 2 refills.

## 2019-12-13 NOTE — Assessment & Plan Note (Signed)
Recommended eating smaller high protein, low fat meals more frequently and exercising 30 mins a day 5 times a week with a goal of 10-15lb weight loss in the next 3 months. Patient voiced their understanding and motivation to adhere to these recommendations.  

## 2019-12-14 LAB — DRUG SCREEN 764883 11+OXYCO+ALC+CRT-BUND
Amphetamines, Urine: NEGATIVE ng/mL
BENZODIAZ UR QL: NEGATIVE ng/mL
Barbiturate: NEGATIVE ng/mL
Cannabinoid Quant, Ur: NEGATIVE ng/mL
Cocaine (Metabolite): NEGATIVE ng/mL
Creatinine: 241.5 mg/dL (ref 20.0–300.0)
Ethanol: NEGATIVE %
Meperidine: NEGATIVE ng/mL
Methadone Screen, Urine: NEGATIVE ng/mL
OPIATE SCREEN URINE: NEGATIVE ng/mL
Oxycodone/Oxymorphone, Urine: NEGATIVE ng/mL
Phencyclidine: NEGATIVE ng/mL
Propoxyphene: NEGATIVE ng/mL
Tramadol: NEGATIVE ng/mL
pH, Urine: 5.4 (ref 4.5–8.9)

## 2019-12-14 LAB — LIPID PANEL W/O CHOL/HDL RATIO
Cholesterol, Total: 192 mg/dL (ref 100–199)
HDL: 45 mg/dL (ref >39)
LDL Chol Calc (NIH): 124 mg/dL — ABNORMAL HIGH (ref 0–99)
Triglycerides: 131 mg/dL (ref 0–149)
VLDL Cholesterol Cal: 23 mg/dL (ref 5–40)

## 2019-12-14 NOTE — Progress Notes (Signed)
Contacted via [place] afternoon Shanon Brow.  Your labs have returned, were you fasting?  They have trended down from previous visit.  I recommend you continue to ensure you take your Atorvastatin daily and we will recheck these fasting next visit.  May consider increasing dose if continued elevation of LDL above goal.  Goal is to get LDL <70.  Any questions? Keep being awesome!! Kindest regards, Trevelle Mcgurn

## 2019-12-15 DIAGNOSIS — G4733 Obstructive sleep apnea (adult) (pediatric): Secondary | ICD-10-CM | POA: Diagnosis not present

## 2019-12-15 DIAGNOSIS — G4719 Other hypersomnia: Secondary | ICD-10-CM | POA: Diagnosis not present

## 2019-12-20 ENCOUNTER — Telehealth: Payer: Self-pay

## 2019-12-20 NOTE — Telephone Encounter (Signed)
PA Approved

## 2019-12-20 NOTE — Telephone Encounter (Signed)
Prior Authorization initiated via CoverMyMeds for Armodafinil 200MG  tablets Key: [id number]

## 2019-12-22 ENCOUNTER — Telehealth (INDEPENDENT_AMBULATORY_CARE_PROVIDER_SITE_OTHER): Payer: BC Managed Care – PPO | Admitting: Nurse Practitioner

## 2019-12-22 ENCOUNTER — Other Ambulatory Visit: Payer: Self-pay

## 2019-12-22 ENCOUNTER — Encounter: Payer: Self-pay | Admitting: Nurse Practitioner

## 2019-12-22 DIAGNOSIS — G4733 Obstructive sleep apnea (adult) (pediatric): Secondary | ICD-10-CM

## 2019-12-22 NOTE — Assessment & Plan Note (Addendum)
Chronic, ongoing.  Had repeat sleep study today due to broken equipment, recommend he use CPAP 100% of the time once available.  At present continue O2 at night only until CPAP equipment in.  Refill on Armodafinil #30 with 2 refills provided at last visit.  Will provide work note if needed by employer, at this time reports no note needed.

## 2019-12-22 NOTE — Patient Instructions (Signed)

## 2019-12-22 NOTE — Progress Notes (Signed)
BP 125/76 (BP Location: Right Arm, Patient Position: Sitting, Cuff Size: Normal)   Pulse 83   Temp 98.4 F (36.9 C) (Oral)   Ht 5\' 10"  (1.778 m)   Wt 299 lb (135.6 kg)   SpO2 96%   BMI 42.90 kg/m    Subjective:    Patient ID: Patrick West, male    DOB: 29-Jun-1966, 54 y.o.   MRN: [id number]  HPI: Patrick West is a 54 y.o. male  Chief Complaint  Patient presents with  . Sleep Apnea    To discuss returning to work date. Patient requesting to return/be out of work until the 27th.    . This visit was completed via telephone due to the restrictions of the COVID-19 pandemic. All issues as above were discussed and addressed but no physical exam was performed. If it was felt that the patient should be evaluated in the office, they were directed there. The patient verbally consented to this visit. Patient was unable to complete an audio/visual visit due to telephone only. Due to the catastrophic nature of the COVID-19 pandemic, this visit was done through audio contact only. . Location of the patient: home . Location of the provider: home . Those involved with this call:  . Provider: Marnee Guarneri, DNP . CMA: Merilyn Baba, CMA . Front Desk/Registration: Don Perking  . Time spent on call: 15 minutes on the phone discussing health concerns. 10 minutes total spent in review of patient's record and preparation of their chart.  . I verified patient identity using two factors (patient name and date of birth). Patient consents verbally to being seen via telemedicine visit today.    SLEEP APNEA Since recent hernia surgery has been wearing O2 only at night as CPAP was broken.  Has not received new machine as of yet.  His FMLA ran out in April -- was not provided new forms to fill out.  At this time they have provided leave of absence until May 26th,   He is not allowed to have any restrictions at work -- they are considering his wearing oxygen at night a restriction.  He went to [place]  yesterday and they told him if they could get prescription sent to them then they could provide his CPAP ASAP.    He reports he does not need a work note today.  Discussed with him that once he obtains CPAP he will no longer need to wear oxygen at night.  Sleep apnea status: stable Duration: chronic Satisfied with current treatment?:  awaiting new CPAP Wakes feeling refreshed:  yes Daytime hypersomnolence:  no Fatigue:  yes Insomnia:  yes Good sleep hygiene:  yes Difficulty falling asleep:  yes Difficulty staying asleep:  no Obesity:  yes Hypertension: no  Pulmonary hypertension:  no Coronary artery disease:  no  Relevant past medical, surgical, family and social history reviewed and updated as indicated. Interim medical history since our last visit reviewed. Allergies and medications reviewed and updated.  Review of Systems  Constitutional: Negative for activity change, diaphoresis, fatigue and fever.  Respiratory: Negative for cough, chest tightness, shortness of breath and wheezing.   Cardiovascular: Negative for chest pain, palpitations and leg swelling.  Gastrointestinal: Negative for abdominal distention, abdominal pain, constipation, diarrhea, nausea and vomiting.  Neurological: Negative.   Psychiatric/Behavioral: Positive for sleep disturbance. Negative for self-injury and suicidal ideas.    Per HPI unless specifically indicated above     Objective:    BP 125/76 (BP Location: Right Arm,  Patient Position: Sitting, Cuff Size: Normal)   Pulse 83   Temp 98.4 F (36.9 C) (Oral)   Ht 5\' 10"  (1.778 m)   Wt 299 lb (135.6 kg)   SpO2 96%   BMI 42.90 kg/m   Wt Readings from Last 3 Encounters:  12/22/19 299 lb (135.6 kg)  12/13/19 296 lb (134.3 kg)  05/18/19 282 lb (127.9 kg)    Physical Exam Vitals and nursing note reviewed.  Constitutional:      General: He is awake. He is not in acute distress.    Appearance: He is well-developed. He is morbidly obese. He is not  ill-appearing.  HENT:     Head: Normocephalic.     Right Ear: Hearing normal. No drainage.     Left Ear: Hearing normal. No drainage.  Eyes:     General: Lids are normal.        Right eye: No discharge.        Left eye: No discharge.     Conjunctiva/sclera: Conjunctivae normal.  Pulmonary:     Effort: Pulmonary effort is normal. No accessory muscle usage or respiratory distress.  Musculoskeletal:     Cervical back: Normal range of motion.  Neurological:     Mental Status: He is alert and oriented to person, place, and time.  Psychiatric:        Mood and Affect: Mood normal.        Behavior: Behavior normal. Behavior is cooperative.        Thought Content: Thought content normal.        Judgment: Judgment normal.     Results for orders placed or performed in visit on 12/13/19  Lipid Panel w/o Chol/HDL Ratio  Result Value Ref Range   Cholesterol, Total 192 100 - 199 mg/dL   Triglycerides 131 0 - 149 mg/dL   HDL 45 >39 mg/dL   VLDL Cholesterol Cal 23 5 - 40 mg/dL   LDL Chol Calc (NIH) 124 (H) 0 - 99 mg/dL  [id number] 11+Oxyco+Alc+Crt-Bund  Result Value Ref Range   Ethanol Negative Cutoff=0.020 %   Amphetamines, Urine Negative Cutoff=1000 ng/mL   Barbiturate Negative Cutoff=200 ng/mL   BENZODIAZ UR QL Negative Cutoff=200 ng/mL   Cannabinoid Quant, Ur Negative Cutoff=50 ng/mL   Cocaine (Metabolite) Negative Cutoff=300 ng/mL   OPIATE SCREEN URINE Negative Cutoff=300 ng/mL   Oxycodone/Oxymorphone, Urine Negative Cutoff=300 ng/mL   Phencyclidine Negative Cutoff=25 ng/mL   Methadone Screen, Urine Negative Cutoff=300 ng/mL   Propoxyphene Negative Cutoff=300 ng/mL   Meperidine Negative Cutoff=200 ng/mL   Tramadol Negative Cutoff=200 ng/mL   Creatinine 241.5 20.0 - 300.0 mg/dL   pH, Urine 5.4 4.5 - 8.9      Assessment & Plan:   Problem List Items Addressed This Visit      Respiratory   Sleep apnea    Chronic, ongoing.  Had repeat sleep study today due to broken equipment,  recommend he use CPAP 100% of the time once available.  At present continue O2 at night only until CPAP equipment in.  Refill on Armodafinil #30 with 2 refills provided at last visit.  Will provide work note if needed by employer, at this time reports no note needed.           I discussed the assessment and treatment plan with the patient. The patient was provided an opportunity to ask questions and all were answered. The patient agreed with the plan and demonstrated an understanding of the instructions.   The patient was  advised to call back or seek an in-person evaluation if the symptoms worsen or if the condition fails to improve as anticipated.   I provided 15+ minutes of time during this encounter.  Follow up plan: Return if symptoms worsen or fail to improve.

## 2019-12-31 DIAGNOSIS — K439 Ventral hernia without obstruction or gangrene: Secondary | ICD-10-CM | POA: Diagnosis not present

## 2020-04-06 ENCOUNTER — Ambulatory Visit (INDEPENDENT_AMBULATORY_CARE_PROVIDER_SITE_OTHER): Payer: BC Managed Care – PPO | Admitting: Nurse Practitioner

## 2020-04-06 ENCOUNTER — Encounter: Payer: Self-pay | Admitting: Nurse Practitioner

## 2020-04-06 ENCOUNTER — Other Ambulatory Visit: Payer: Self-pay

## 2020-04-06 VITALS — BP 132/81 | HR 79 | Temp 98.4°F | Wt 289.2 lb

## 2020-04-06 DIAGNOSIS — E7800 Pure hypercholesterolemia, unspecified: Secondary | ICD-10-CM | POA: Diagnosis not present

## 2020-04-06 DIAGNOSIS — F322 Major depressive disorder, single episode, severe without psychotic features: Secondary | ICD-10-CM

## 2020-04-06 DIAGNOSIS — F5101 Primary insomnia: Secondary | ICD-10-CM

## 2020-04-06 DIAGNOSIS — G4733 Obstructive sleep apnea (adult) (pediatric): Secondary | ICD-10-CM

## 2020-04-06 MED ORDER — LORAZEPAM 0.5 MG PO TABS: ORAL_TABLET | ORAL | 1 refills | Status: DC

## 2020-04-06 MED ORDER — SERTRALINE HCL 100 MG PO TABS
100.0000 mg | ORAL_TABLET | Freq: Every day | ORAL | 3 refills | Status: DC
Start: 2020-04-06 — End: 2023-09-20

## 2020-04-06 MED ORDER — ARMODAFINIL 200 MG PO TABS: ORAL_TABLET | ORAL | 2 refills | Status: DC

## 2020-04-06 NOTE — Assessment & Plan Note (Signed)
Chronic, ongoing with use of Ativan as needed.  Has minimally used this since February fill and is working on cutting back since improved mood with Zoloft.  Increase Zoloft 100 MG due to increased irritability with life changes and continue Ativan as needed, recommend continued minimal use.  Recommended he continue to cut back on use of this with goal to discontinue. Refill sent #30 and 1 refill of Ativan.  He denies SI/HI.  Return in 3 months.

## 2020-04-06 NOTE — Progress Notes (Signed)
BP 132/81   Pulse 79   Temp 98.4 F (36.9 C) (Oral)   Wt 289 lb 3.2 oz (131.2 kg)   SpO2 95%   BMI 41.50 kg/m    Subjective:    Patient ID: Patrick West, male    DOB: [birth year], 54 y.o.   MRN: [id number]  HPI: TERUO STILLEY is a 54 y.o. male  Chief Complaint  Patient presents with  . Depression  . Sleep Apnea   ANXIETY Continues on Ativan as needed, on review of PDMP his last fill of this was4/29/2021for 30 tablets. 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 Pt isaware of risks of benzomedication use to include increased sedation, respiratory suppression, falls, dependence and cardiovascular events. Ptwould like to continue treatment as benefit determined to outweigh risk. Did at one time take Celexa.Started on Zoloft in January and reports benefit with this and has been able to cut back some on Ativan, how with 30 pills lasting him 2 months. Does endorse increased irritability as daughter moved back into home, would like to trial increase Zoloft dose. Mood status:controlled Satisfied with current treatment?:yes Symptom severity:mild Duration of current treatment :chronic Side effects:no Medication compliance:good compliance Psychotherapy/counseling:none Depressed mood:no Anxious mood:yes Anhedonia:no Significant weight loss or gain:no Insomnia:none Fatigue:no Feelings of worthlessness or guilt:no Impaired concentration/indecisiveness:no Suicidal ideations:no Hopelessness:no Crying spells:no Depression screen [facility] 2/9 04/06/2020 12/13/2019 08/30/2019 04/18/2019 11/24/2018  Decreased Interest 0 1 2 0 2  Down, Depressed, Hopeless 0 1 2 1 1   PHQ - 2 Score 0 2 4 1 3   Altered sleeping 0 2 2 1 1   Tired, decreased energy 0 2 3 2 1   Change in appetite 0 1 3 1 1   Feeling bad or failure about yourself  0 1 3 0 1  Trouble concentrating 0 0 2 1 1   Moving slowly or fidgety/restless 0 0 3 0 1  Suicidal  thoughts 0 0 0 0 1  PHQ-9 Score 0 8 20 6 10   Difficult doing work/chores Not difficult at all Somewhat difficult Somewhat difficult Somewhat difficult -  Some recent data might be hidden  Anhedonia: no Weight changes: no Insomnia: yeshard to fall asleep Hypersomnia: no Fatigue/loss of energy: no Feelings of worthlessness: no Feelings of guilt: no Impaired concentration/indecisiveness: yes Suicidal ideations: no Crying spells: no Recent Stressors/Life Changes: yes Relationship problems: no Family stress: no Financial stress: no Job stress: yes Recent death/loss: no  INSOMNIA& OSA Continues on Nuvigil for OSA to prevent "faling asleep at desk", last fill 03/19/20 for #30 pills on PDMP review. Using CPAP at bedtime daily, this was recently started -- is helping some with sleepiness during work.   Duration:chronic Satisfied with sleep quality:yes Difficulty falling asleep:no Difficulty staying asleep:no Waking a few hours after sleep onset:no Early morning awakenings:no Daytime hypersomnolence:no Wakes feeling refreshed:yes Good sleep hygiene:yes Apnea:no Snoring:yes Depressed/anxious mood:no Recent stress:no Restless legs/nocturnal leg cramps:no Chronic pain/arthritis:no History of sleep study:yes Treatments attempted:none  HYPERLIPIDEMIA Continues on Lipitor.  Hyperlipidemia status: good compliance Satisfied with current treatment? yes Side effects: no Medication compliance: good compliance Past cholesterol meds:atorvastain (lipitor) Supplements: none Aspirin: no The 10-year ASCVD risk score Mikey Bussing DC Jr., et al., 2013) is: 6.9%   Values used to calculate the score:     Age: 68 years     Sex: Male     Is Non-Hispanic African American: Yes     Diabetic: No     Tobacco smoker: No  Systolic Blood Pressure: 109 mmHg     Is BP treated: No     HDL Cholesterol: 45 mg/dL     Total Cholesterol: 192 mg/dL  Relevant past  medical, surgical, family and social history reviewed and updated as indicated. Interim medical history since our last visit reviewed. Allergies and medications reviewed and updated.  Review of Systems  Constitutional: Negative for activity change, diaphoresis, fatigue and fever.  Respiratory: Negative for cough, chest tightness, shortness of breath and wheezing.   Cardiovascular: Negative for chest pain, palpitations and leg swelling.  Gastrointestinal: Negative.   Neurological: Negative.   Psychiatric/Behavioral: Positive for decreased concentration and sleep disturbance. Negative for self-injury and suicidal ideas. The patient is nervous/anxious.     Per HPI unless specifically indicated above     Objective:    BP 132/81   Pulse 79   Temp 98.4 F (36.9 C) (Oral)   Wt 289 lb 3.2 oz (131.2 kg)   SpO2 95%   BMI 41.50 kg/m   Wt Readings from Last 3 Encounters:  04/06/20 289 lb 3.2 oz (131.2 kg)  12/22/19 299 lb (135.6 kg)  12/13/19 296 lb (134.3 kg)    Physical Exam Vitals and nursing note reviewed.  Constitutional:      General: He is awake. He is not in acute distress.    Appearance: He is well-developed. He is morbidly obese. He is not ill-appearing.  HENT:     Head: Normocephalic and atraumatic.     Right Ear: Hearing normal. No drainage.     Left Ear: Hearing normal. No drainage.  Eyes:     General: Lids are normal.        Right eye: No discharge.        Left eye: No discharge.     Conjunctiva/sclera: Conjunctivae normal.     Pupils: Pupils are equal, round, and reactive to light.  Neck:     Vascular: No carotid bruit.  Cardiovascular:     Rate and Rhythm: Normal rate and regular rhythm.     Heart sounds: Normal heart sounds, S1 normal and S2 normal. No murmur heard.  No gallop.   Pulmonary:     Effort: Pulmonary effort is normal. No accessory muscle usage or respiratory distress.     Breath sounds: Normal breath sounds.  Abdominal:     General: A surgical  scar is present. Bowel sounds are normal. There is no distension.     Palpations: Abdomen is soft.     Tenderness: There is no abdominal tenderness.  Musculoskeletal:        General: Normal range of motion.     Cervical back: Normal range of motion and neck supple.     Right lower leg: No edema.     Left lower leg: No edema.  Skin:    General: Skin is warm and dry.     Capillary Refill: Capillary refill takes less than 2 seconds.     Findings: No rash.  Neurological:     Mental Status: He is alert and oriented to person, place, and time.     Deep Tendon Reflexes: Reflexes are normal and symmetric.  Psychiatric:        Attention and Perception: Attention normal.        Mood and Affect: Mood normal.        Speech: Speech normal.        Behavior: Behavior normal. Behavior is cooperative.        Thought Content: Thought  content normal.     Results for orders placed or performed in visit on 12/13/19  Lipid Panel w/o Chol/HDL Ratio  Result Value Ref Range   Cholesterol, Total 192 100 - 199 mg/dL   Triglycerides 131 0 - 149 mg/dL   HDL 45 >39 mg/dL   VLDL Cholesterol Cal 23 5 - 40 mg/dL   LDL Chol Calc (NIH) 124 (H) 0 - 99 mg/dL  [id number] 11+Oxyco+Alc+Crt-Bund  Result Value Ref Range   Ethanol Negative Cutoff=0.020 %   Amphetamines, Urine Negative Cutoff=1000 ng/mL   Barbiturate Negative Cutoff=200 ng/mL   BENZODIAZ UR QL Negative Cutoff=200 ng/mL   Cannabinoid Quant, Ur Negative Cutoff=50 ng/mL   Cocaine (Metabolite) Negative Cutoff=300 ng/mL   OPIATE SCREEN URINE Negative Cutoff=300 ng/mL   Oxycodone/Oxymorphone, Urine Negative Cutoff=300 ng/mL   Phencyclidine Negative Cutoff=25 ng/mL   Methadone Screen, Urine Negative Cutoff=300 ng/mL   Propoxyphene Negative Cutoff=300 ng/mL   Meperidine Negative Cutoff=200 ng/mL   Tramadol Negative Cutoff=200 ng/mL   Creatinine 241.5 20.0 - 300.0 mg/dL   pH, Urine 5.4 4.5 - 8.9      Assessment & Plan:   Problem List Items Addressed  This Visit      Respiratory   Sleep apnea    Chronic, consistently using CPAP.  Recommend continued use with future goal of improved symptoms and possible discontinuation of Nuvigil which he has been on for years.        Other   Depression, major, single episode, severe ([place]) - Primary    Chronic, ongoing with use of Ativan as needed.  Has minimally used this since February fill and is working on cutting back since improved mood with Zoloft.  Increase Zoloft 100 MG due to increased irritability with life changes and continue Ativan as needed, recommend continued minimal use.  Recommended he continue to cut back on use of this with goal to discontinue. Refill sent #30 and 1 refill of Ativan.  He denies SI/HI.  Return in 3 months.      Relevant Medications   sertraline (ZOLOFT) 100 MG tablet   Insomnia    Chronic, ongoing.  Continue reduction of Ativan, as has been minimally using since February, and continue Zoloft for mood.  Recommend use of CPAP 100% of the time.      Severe obesity (BMI >= 40) (HCC)    Recommended eating smaller high protein, low fat meals more frequently and exercising 30 mins a day 5 times a week with a goal of 10-15lb weight loss in the next 3 months. Patient voiced their understanding and motivation to adhere to these recommendations.       Hypercholesteremia    Chronic, ongoing.  Continue current medication regimen and adjust as needed.  Lipid panel and BMP today.      Relevant Orders   Basic metabolic panel   Lipid Panel w/o Chol/HDL Ratio       Follow up plan: Return in about 3 months (around 07/07/2020) for MOOD.

## 2020-04-06 NOTE — Patient Instructions (Signed)

## 2020-04-06 NOTE — Assessment & Plan Note (Signed)
Chronic, consistently using CPAP.  Recommend continued use with future goal of improved symptoms and possible discontinuation of Nuvigil which he has been on for years.

## 2020-04-06 NOTE — Assessment & Plan Note (Signed)
Recommended eating smaller high protein, low fat meals more frequently and exercising 30 mins a day 5 times a week with a goal of 10-15lb weight loss in the next 3 months. Patient voiced their understanding and motivation to adhere to these recommendations.  

## 2020-04-06 NOTE — Assessment & Plan Note (Signed)
Chronic, ongoing.  Continue current medication regimen and adjust as needed. Lipid panel and BMP today.   ?

## 2020-04-06 NOTE — Assessment & Plan Note (Signed)
Chronic, ongoing.  Continue reduction of Ativan, as has been minimally using since February, and continue Zoloft for mood.  Recommend use of CPAP 100% of the time.

## 2020-04-07 LAB — BASIC METABOLIC PANEL
BUN/Creatinine Ratio: 8 — ABNORMAL LOW (ref 9–20)
BUN: 8 mg/dL (ref 6–24)
CO2: 25 mmol/L (ref 20–29)
Calcium: 9.5 mg/dL (ref 8.7–10.2)
Chloride: 101 mmol/L (ref 96–106)
Creatinine, Ser: 0.96 mg/dL (ref 0.76–1.27)
GFR calc Af Amer: 103 mL/min/{1.73_m2} (ref >59)
GFR calc non Af Amer: 89 mL/min/{1.73_m2} (ref >59)
Glucose: 99 mg/dL (ref 65–99)
Potassium: 4.3 mmol/L (ref 3.5–5.2)
Sodium: 140 mmol/L (ref 134–144)

## 2020-04-07 LAB — LIPID PANEL W/O CHOL/HDL RATIO
Cholesterol, Total: 271 mg/dL — ABNORMAL HIGH (ref 100–199)
HDL: 49 mg/dL (ref >39)
LDL Chol Calc (NIH): 200 mg/dL — ABNORMAL HIGH (ref 0–99)
Triglycerides: 124 mg/dL (ref 0–149)
VLDL Cholesterol Cal: 22 mg/dL (ref 5–40)

## 2020-04-08 NOTE — Progress Notes (Signed)
Contacted via MyChart  Good evening Patrick West, your labs have returned.  Kidney function remains stable.  Cholesterol levels continue to be elevated.  Are you taking your Atorvastatin every day?  Were you fasting for labs?  Would like to check fasting next visit, at this time ensure you are taking your Atorvastatin every day -- try not to miss doses.  Any questions? Keep being awesome!!  Thank you for allowing me to participate in your care. Kindest regards, Kelleen Stolze

## 2020-07-04 DIAGNOSIS — L03031 Cellulitis of right toe: Secondary | ICD-10-CM | POA: Diagnosis not present

## 2020-07-04 DIAGNOSIS — B351 Tinea unguium: Secondary | ICD-10-CM | POA: Diagnosis not present

## 2020-07-04 DIAGNOSIS — L03032 Cellulitis of left toe: Secondary | ICD-10-CM | POA: Diagnosis not present

## 2020-07-05 ENCOUNTER — Ambulatory Visit: Payer: BC Managed Care – PPO | Admitting: Nurse Practitioner

## 2020-07-18 DIAGNOSIS — L03032 Cellulitis of left toe: Secondary | ICD-10-CM | POA: Diagnosis not present

## 2020-07-18 DIAGNOSIS — L03031 Cellulitis of right toe: Secondary | ICD-10-CM | POA: Diagnosis not present

## 2022-04-12 ENCOUNTER — Emergency Department: Payer: BC Managed Care – PPO

## 2022-04-12 ENCOUNTER — Other Ambulatory Visit: Payer: Self-pay

## 2022-04-12 DIAGNOSIS — Y92410 Unspecified street and highway as the place of occurrence of the external cause: Secondary | ICD-10-CM | POA: Diagnosis not present

## 2022-04-12 DIAGNOSIS — Z23 Encounter for immunization: Secondary | ICD-10-CM | POA: Diagnosis not present

## 2022-04-12 DIAGNOSIS — S6992XA Unspecified injury of left wrist, hand and finger(s), initial encounter: Secondary | ICD-10-CM | POA: Diagnosis not present

## 2022-04-12 DIAGNOSIS — S61213A Laceration without foreign body of left middle finger without damage to nail, initial encounter: Secondary | ICD-10-CM | POA: Insufficient documentation

## 2022-04-12 DIAGNOSIS — S61012A Laceration without foreign body of left thumb without damage to nail, initial encounter: Secondary | ICD-10-CM | POA: Diagnosis not present

## 2022-04-12 DIAGNOSIS — M7989 Other specified soft tissue disorders: Secondary | ICD-10-CM | POA: Diagnosis not present

## 2022-04-12 NOTE — ED Notes (Signed)
Laceration noted to base of left thumb on posterior aspect, no active bleeding at this time. Wound rinsed with saline flushes and redressed with 4 by 4's and gauze wrap at this time.
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
## 2022-04-13 ENCOUNTER — Emergency Department
Admission: EM | Admit: 2022-04-13 | Discharge: 2022-04-13 | Disposition: A | Discharge status: Home / Self Care | Payer: BC Managed Care – PPO | Attending: Emergency Medicine | Admitting: Emergency Medicine

## 2022-04-13 DIAGNOSIS — S61012A Laceration without foreign body of left thumb without damage to nail, initial encounter: Secondary | ICD-10-CM

## 2022-04-13 MED ORDER — TETANUS-DIPHTH-ACELL PERTUSSIS 5-2.5-18.5 LF-MCG/0.5 IM SUSY
0.5000 mL | PREFILLED_SYRINGE | Freq: Once | INTRAMUSCULAR | Status: AC
  Administered 2022-04-13: 0.5 mL via INTRAMUSCULAR
  Filled 2022-04-13: qty 0.5

## 2022-04-13 NOTE — ED Notes (Signed)
See triage note. Bleeding controlled and wet dressing placed at this time.

## 2022-04-13 NOTE — Discharge Instructions (Addendum)
We placed 3 stitches that will absorb/dissolve on their own  Gently wash the wound with soap and water.  It is okay to shower, but do not submerge in a bath or go swimming as it is healing.  Do not vigorously scrub.   Gently pat dry.   Once dry, then apply Neosporin or bacitracin or even Vaseline ointment to the area to act as a barrier to help prevent infection.
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
## 2022-04-15 ENCOUNTER — Telehealth: Payer: Self-pay | Admitting: *Deleted

## 2022-04-15 NOTE — Telephone Encounter (Signed)
Transition Care Management Unsuccessful Follow-up Telephone Call  Date of discharge and from where:  [facility] 8[facility]27  Attempts:  1st Attempt  Reason for unsuccessful TCM follow-up call:  Left voice message

## 2022-04-16 NOTE — Telephone Encounter (Signed)
Transition Care Management Unsuccessful Follow-up Telephone Call  Date of discharge and from where:  [facility]   Attempts:  2nd Attempt  Reason for unsuccessful TCM follow-up call:  Left voice message      

## 2023-07-02 ENCOUNTER — Ambulatory Visit
Admission: EM | Admit: 2023-07-02 | Discharge: 2023-07-02 | Disposition: A | Discharge status: Home / Self Care | Payer: BC Managed Care – PPO

## 2023-07-02 ENCOUNTER — Ambulatory Visit: Payer: BC Managed Care – PPO

## 2023-07-02 DIAGNOSIS — S6710XA Crushing injury of unspecified finger(s), initial encounter: Secondary | ICD-10-CM

## 2023-07-02 DIAGNOSIS — M7989 Other specified soft tissue disorders: Secondary | ICD-10-CM | POA: Diagnosis not present

## 2023-07-02 DIAGNOSIS — M79645 Pain in left finger(s): Secondary | ICD-10-CM | POA: Diagnosis not present

## 2023-07-02 NOTE — ED Triage Notes (Signed)
Pt c/o L middle finger pain x1 wk. States he smashed it against door.

## 2023-07-02 NOTE — ED Provider Notes (Signed)
[facility]    CSN: [id number] Arrival date & time: 07/02/23  0901      History   Chief Complaint Chief Complaint  Patient presents with   Finger Injury    HPI Patrick West is a 57 y.o. male.   57 year old male patient, Patrick West, presents to urgent care for evaluation of left middle finger pain x 1 week.  Patient states he smashed his finger on a piece of furniture on 06/26/2023.  Patient has swelling and bruising noted to distal tip of left middle finger, good range of motion.  No erythema, no drainage  The history is provided by the patient. No language interpreter was used.    Past Medical History:  Diagnosis Date   Colon cancer [facility]) 2013   adenocarcinoma   ED (erectile dysfunction)    Hyperlipidemia    Sleep apnea    getting scheduled for sleep study   Wears dentures    partial upper    Patient Active Problem List   Diagnosis Date Noted   Crushing injury of finger 07/02/2023   Finger pain, left 07/02/2023   History of hernia repair 11/09/2019   Controlled substance agreement signed 08/25/2018   Meralgia paraesthetica, right 08/04/2017   Hypercholesteremia 08/04/2017   Severe obesity (BMI >= 40) (HCC) 07/03/2017   Depression, major, single episode, severe (HCC) 12/26/2016   Insomnia 12/26/2016   Personal history of colon cancer    Plantar fasciitis 06/24/2016   Sleep apnea 06/24/2016    Past Surgical History:  Procedure Laterality Date   ABDOMINAL HERNIA REPAIR     COLONOSCOPY WITH PROPOFOL N/A 07/07/2016   Procedure: COLONOSCOPY WITH PROPOFOL;  Surgeon: Midge Minium, MD;  Location: [facility];  Service: Endoscopy;  Laterality: N/A;  sleep apnea   PARTIAL COLECTOMY  02/2012       Home Medications    Prior to Admission medications   Medication Sig Start Date End Date Taking? Authorizing Provider  Armodafinil 200 MG TABS TAKE 200MG  BY MOUTH DAILY 04/19/20  Yes Cannady, Jolene T, NP  atorvastatin (LIPITOR) 40 MG tablet  Take 1 tablet (40 mg total) by mouth daily. 11/09/19  Yes Cannady, Jolene T, NP  fluticasone (FLONASE) 50 MCG/ACT nasal spray Place into the nose. 11/11/22  Yes [provider]  hydrochlorothiazide (HYDRODIURIL) 25 MG tablet Take by mouth. 11/11/22  Yes [provider]  loperamide (IMODIUM) 2 MG capsule Take by mouth. 09/08/22  Yes [provider]  LORazepam (ATIVAN) 0.5 MG tablet Take one tablet (0.5 MG) every other day as needed for anxiety and continue to attempt to cut back on use of this. 04/06/20  Yes Cannady, Jolene T, NP  sertraline (ZOLOFT) 100 MG tablet Take 1 tablet (100 mg total) by mouth daily. 04/06/20  Yes Aura Dials T, NP  sildenafil (VIAGRA) 100 MG tablet Take by mouth. 11/11/22  Yes [provider]    Family History Family History  Problem Relation Age of Onset   Heart disease Mother    Hypertension Mother    Heart disease Father    Hypertension Father    Stroke Father    Glaucoma Father    Breast cancer Sister 22   Breast cancer Maternal Aunt        dx in her late 20s-30s   Colon cancer Maternal Uncle        smoker   Breast cancer Paternal Aunt        dx in her 64s   Throat  cancer Paternal Uncle        smoker   Breast cancer Maternal Aunt        dx at age 11   Colon cancer Maternal Aunt 29   Breast cancer Paternal Aunt        dx around age 61   Colon cancer Cousin        dx around age 84-17; paternal cousin   Colon cancer Cousin 56       paternal cousin    Social History Social History   Tobacco Use   Smoking status: Never   Smokeless tobacco: Never  Vaping Use   Vaping status: Never Used  Substance Use Topics   Alcohol use: Yes    Alcohol/week: 1.0 standard drink of alcohol    Types: 1 Glasses of wine per week    Comment: on occasion   Drug use: No     Allergies   Patient has no known allergies.   Review of Systems Review of Systems  Constitutional:  Negative for fever.  Skin:  Positive for color  change.  All other systems reviewed and are negative.    Physical Exam Triage Vital Signs ED Triage Vitals  Encounter Vitals Group     BP 07/02/23 0918 (!) 143/84     Systolic BP Percentile --      Diastolic BP Percentile --      Pulse Rate 07/02/23 0918 70     Resp 07/02/23 0918 16     Temp 07/02/23 0918 98.4 F (36.9 C)     Temp Source 07/02/23 0918 Oral     SpO2 07/02/23 0918 94 %     Weight 07/02/23 0918 295 lb (133.8 kg)     Height 07/02/23 0918 5\' 10"  (1.778 m)     Head Circumference --      Peak Flow --      Pain Score 07/02/23 0922 7     Pain Loc --      Pain Education --      Exclude from Growth Chart --    No data found.  Updated Vital Signs BP (!) 143/84 (BP Location: Left Arm)   Pulse 70   Temp 98.4 F (36.9 C) (Oral)   Resp 16   Ht 5\' 10"  (1.778 m)   Wt 295 lb (133.8 kg)   SpO2 94%   BMI 42.33 kg/m   Visual Acuity Right Eye Distance:   Left Eye Distance:   Bilateral Distance:    Right Eye Near:   Left Eye Near:    Bilateral Near:     Physical Exam Vitals and nursing note reviewed.  Constitutional:      General: He is not in acute distress.    Appearance: He is well-developed and well-groomed.  HENT:     Head: Normocephalic and atraumatic.  Eyes:     Conjunctiva/sclera: Conjunctivae normal.  Cardiovascular:     Rate and Rhythm: Normal rate and regular rhythm.     Pulses: Normal pulses.          Dorsalis pedis pulses are 2+ on the left side.     Heart sounds: Normal heart sounds. No murmur heard. Pulmonary:     Effort: Pulmonary effort is normal. No respiratory distress.     Breath sounds: Normal breath sounds.  Abdominal:     Palpations: Abdomen is soft.     Tenderness: There is no abdominal tenderness.  Musculoskeletal:        General: No  swelling.     Cervical back: Neck supple.  Skin:    General: Skin is warm and dry.     Capillary Refill: Capillary refill takes less than 2 seconds.     Findings: Ecchymosis present.      Comments: Patient has swelling and bruising noted to distal tip of left middle finger(3rd), good range of motion.  No erythema, no drainage.  Neurological:     General: No focal deficit present.     Mental Status: He is alert and oriented to person, place, and time.     GCS: GCS eye subscore is 4. GCS verbal subscore is 5. GCS motor subscore is 6.     Cranial Nerves: No cranial nerve deficit.     Sensory: No sensory deficit.  Psychiatric:        Mood and Affect: Mood normal.        Behavior: Behavior is cooperative.      UC Treatments / Results  Labs (all labs ordered are listed, but only abnormal results are displayed) Labs Reviewed - No data to display  EKG   Radiology DG Finger Middle Left  Result Date: 07/02/2023 CLINICAL DATA:  Left middle finger pain for the past week. Smashed against door. EXAM: LEFT MIDDLE FINGER 2+V COMPARISON:  None Available. FINDINGS: There is no evidence of fracture or dislocation. There is no evidence of arthropathy or other focal bone abnormality. Soft tissue swelling at tip of the long finger. IMPRESSION: 1. Soft tissue swelling without acute osseous abnormality. Electronically Signed   By: Obie Dredge M.D.   On: 07/02/2023 13:04    Procedures Procedures (including critical care time)  Medications Ordered in UC Medications - No data to display  Initial Impression / Assessment and Plan / UC Course  I have reviewed the triage vital signs and the nursing notes.  Pertinent labs & imaging results that were available during my care of the patient were reviewed by me and considered in my medical decision making (see chart for details).  Clinical Course as of 07/02/23 1324  Thu Jul 02, 2023  7829 Patient to x-ray [JD]  1003 Pt returned,awaiting xray result [JD]  1049 Wet read negative for acute findings(no fracture,no dislocation [JD]    Clinical Course User Index [JD] Kenidee Cregan, Para March, NP  Discussed exam findings and plan of care with  patient, strict go to ER precautions given.   Patient verbalized understanding to this provider.   Ddx: Crushing injury to finger, finger pain Final Clinical Impressions(s) / UC Diagnoses   Final diagnoses:  Crushing injury of finger, initial encounter  Finger pain, left     Discharge Instructions      Your xray is negative for acute fracture or dislocation. Rest,ice,elevate.  May alternate Tylenol /ibuprofen as label directed for discomfort.  Follow-up with orthopedics of your choice in 1 week if symptoms persist-call for appointment.  Most likely your nail will fall off, trim it as it grows out.      ED Prescriptions   None    PDMP not reviewed this encounter.   Clancy Gourd, NP 07/02/23 1324

## 2023-07-02 NOTE — Discharge Instructions (Addendum)
Your xray is negative for acute fracture or dislocation. Rest,ice,elevate.  May alternate Tylenol /ibuprofen as label directed for discomfort.  Follow-up with orthopedics of your choice in 1 week if symptoms persist-call for appointment.  Most likely your nail will fall off, trim it as it grows out.

## 2023-09-20 NOTE — Patient Instructions (Addendum)
 Call [facility] and see if they can refer you to vascular at the [facility] to check on circulation of legs, suspect there is some peripheral vascular disease.  Healthy Eating, Adult Healthy eating may help you get and keep a healthy body weight, reduce the risk of chronic disease, and live a long and productive life. It is important to follow a healthy eating pattern. Your nutritional and calorie needs should be met mainly by different nutrient-rich foods. What are tips for following this plan? Reading food labels Read labels and choose the following: Reduced or low sodium products. Juices with 100% fruit juice. Foods with low saturated fats (<3 g per serving) and high polyunsaturated and monounsaturated fats. Foods with whole grains, such as whole wheat, cracked wheat, brown rice, and wild rice. Whole grains that are fortified with folic acid. This is recommended for females who are pregnant or who want to become pregnant. Read labels and do not eat or drink the following: Foods or drinks with added sugars. These include foods that contain brown sugar, corn sweetener, corn syrup, dextrose , fructose, glucose, high-fructose corn syrup, honey, invert sugar, lactose, malt syrup, maltose, molasses, raw sugar, sucrose, trehalose, or turbinado sugar. Limit your intake of added sugars to less than 10% of your total daily calories. Do not eat more than the following amounts of added sugar per day: 6 teaspoons (25 g) for females. 9 teaspoons (38 g) for males. Foods that contain processed or refined starches and grains. Refined grain products, such as white flour, degermed cornmeal, white bread, and white rice. Shopping Choose nutrient-rich snacks, such as vegetables, whole fruits, and nuts. Avoid high-calorie and high-sugar snacks, such as potato chips, fruit snacks, and candy. Use oil-based dressings and spreads on foods instead of solid fats such as butter, margarine, sour cream, or cream cheese. Limit  pre-made sauces, mixes, and instant products such as flavored rice, instant noodles, and ready-made pasta. Try more plant-protein sources, such as tofu, tempeh, black beans, edamame, lentils, nuts, and seeds. Explore eating plans such as the Mediterranean diet or vegetarian diet. Try heart-healthy dips made with beans and healthy fats like hummus and guacamole. Vegetables go great with these. Cooking Use oil to saut or stir-fry foods instead of solid fats such as butter, margarine, or lard. Try baking, boiling, grilling, or broiling instead of frying. Remove the fatty part of meats before cooking. Steam vegetables in water  or broth. Meal planning  At meals, imagine dividing your plate into fourths: One-half of your plate is fruits and vegetables. One-fourth of your plate is whole grains. One-fourth of your plate is protein, especially lean meats, poultry, eggs, tofu, beans, or nuts. Include low-fat dairy as part of your daily diet. Lifestyle Choose healthy options in all settings, including home, work, school, restaurants, or stores. Prepare your food safely: Wash your hands after handling raw meats. Where you prepare food, keep surfaces clean by regularly washing with hot, soapy water . Keep raw meats separate from ready-to-eat foods, such as fruits and vegetables. Cook seafood, meat, poultry, and eggs to the recommended temperature. Get a food thermometer. Store foods at safe temperatures. In general: Keep cold foods at 11F (4.4C) or below. Keep hot foods at 111F (60C) or above. Keep your freezer at [facility] (-17.8C) or below. Foods are not safe to eat if they have been between the temperatures of 40-111F (4.4-60C) for more than 2 hours. What foods should I eat? Fruits Aim to eat 1-2 cups of fresh, canned (in natural juice), or  frozen fruits each day. One cup of fruit equals 1 small apple, 1 large banana, 8 large strawberries, 1 cup (237 g) canned fruit,  cup (82 g) dried  fruit, or 1 cup (240 mL) 100% juice. Vegetables Aim to eat 2-4 cups of fresh and frozen vegetables each day, including different varieties and colors. One cup of vegetables equals 1 cup (91 g) broccoli or cauliflower florets, 2 medium carrots, 2 cups (150 g) raw, leafy greens, 1 large tomato, 1 large bell pepper, 1 large sweet potato, or 1 medium white potato. Grains Aim to eat 5-10 ounce-equivalents of whole grains each day. Examples of 1 ounce-equivalent of grains include 1 slice of bread, 1 cup (40 g) ready-to-eat cereal, 3 cups (24 g) popcorn, or  cup (93 g) cooked rice. Meats and other proteins Try to eat 5-7 ounce-equivalents of protein each day. Examples of 1 ounce-equivalent of protein include 1 egg,  oz nuts (12 almonds, 24 pistachios, or 7 walnut halves), 1/4 cup (90 g) cooked beans, 6 tablespoons (90 g) hummus or 1 tablespoon (16 g) peanut butter. A cut of meat or fish that is the size of a deck of cards is about 3-4 ounce-equivalents (85 g). Of the protein you eat each week, try to have at least 8 sounce (227 g) of seafood. This is about 2 servings per week. This includes salmon, trout, herring, sardines, and anchovies. Dairy Aim to eat 3 cup-equivalents of fat-free or low-fat dairy each day. Examples of 1 cup-equivalent of dairy include 1 cup (240 mL) milk, 8 ounces (250 g) yogurt, 1 ounces (44 g) natural cheese, or 1 cup (240 mL) fortified soy milk. Fats and oils Aim for about 5 teaspoons (21 g) of fats and oils per day. Choose monounsaturated fats, such as canola and olive oils, mayonnaise made with olive oil or avocado oil, avocados, peanut butter, and most nuts, or polyunsaturated fats, such as sunflower, corn, and soybean oils, walnuts, pine nuts, sesame seeds, sunflower seeds, and flaxseed. Beverages Aim for 6 eight-ounce glasses of water  per day. Limit coffee to 3-5 eight-ounce cups per day. Limit caffeinated beverages that have added calories, such as soda and energy drinks. If  you drink alcohol: Limit how much you have to: 0-1 drink a day if you are male. 0-2 drinks a day if you are male. Know how much alcohol is in your drink. In the [place] one drink is one 12 oz bottle of beer (355 mL), one 5 oz glass of wine (148 mL), or one 1 oz glass of hard liquor (44 mL). Seasoning and other foods Try not to add too much salt to your food. Try using herbs and spices instead of salt. Try not to add sugar to food. This information is based on [place] nutrition guidelines. To learn more, visit disposablenylon.be. Exact amounts may vary. You may need different amounts. This information is not intended to replace advice given to you by your health care provider. Make sure you discuss any questions you have with your health care provider. Document Revised: 05/05/2022 Document Reviewed: 05/05/2022 Elsevier Patient Education  2024 [employer].

## 2023-09-23 ENCOUNTER — Encounter: Payer: Self-pay | Admitting: Nurse Practitioner

## 2023-09-23 ENCOUNTER — Ambulatory Visit: Payer: BC Managed Care – PPO | Admitting: Nurse Practitioner

## 2023-09-23 VITALS — BP 136/79 | HR 94 | Temp 98.0°F | Ht 69.8 in | Wt 313.4 lb

## 2023-09-23 DIAGNOSIS — G4733 Obstructive sleep apnea (adult) (pediatric): Secondary | ICD-10-CM | POA: Diagnosis not present

## 2023-09-23 DIAGNOSIS — Z1159 Encounter for screening for other viral diseases: Secondary | ICD-10-CM

## 2023-09-23 DIAGNOSIS — R7301 Impaired fasting glucose: Secondary | ICD-10-CM | POA: Diagnosis not present

## 2023-09-23 DIAGNOSIS — R7309 Other abnormal glucose: Secondary | ICD-10-CM | POA: Insufficient documentation

## 2023-09-23 DIAGNOSIS — N400 Enlarged prostate without lower urinary tract symptoms: Secondary | ICD-10-CM | POA: Diagnosis not present

## 2023-09-23 DIAGNOSIS — F5101 Primary insomnia: Secondary | ICD-10-CM | POA: Diagnosis not present

## 2023-09-23 DIAGNOSIS — Z1211 Encounter for screening for malignant neoplasm of colon: Secondary | ICD-10-CM

## 2023-09-23 DIAGNOSIS — E7800 Pure hypercholesterolemia, unspecified: Secondary | ICD-10-CM | POA: Diagnosis not present

## 2023-09-23 DIAGNOSIS — F322 Major depressive disorder, single episode, severe without psychotic features: Secondary | ICD-10-CM | POA: Diagnosis not present

## 2023-09-23 DIAGNOSIS — Z85038 Personal history of other malignant neoplasm of large intestine: Secondary | ICD-10-CM

## 2023-09-23 MED ORDER — TRIAMCINOLONE ACETONIDE 0.1 % EX CREA: 1.0000 | TOPICAL_CREAM | Freq: Two times a day (BID) | CUTANEOUS | 1 refills | Status: AC

## 2023-09-23 MED ORDER — ROSUVASTATIN CALCIUM 10 MG PO TABS: 10.0000 mg | ORAL_TABLET | Freq: Every day | ORAL | 3 refills | Status: AC

## 2023-09-23 NOTE — Assessment & Plan Note (Signed)
 BMI 45.23. Recommended eating smaller high protein, low fat meals more frequently and exercising 30 mins a day 5 times a week with a goal of 10-15lb weight loss in the next 3 months. Patient voiced their understanding and motivation to adhere to these recommendations.

## 2023-09-23 NOTE — Progress Notes (Signed)
 New Patient Office Visit  Subjective    Patient ID: Patrick West, male    DOB: 22-May-1966  Age: 58 y.o. MRN: [id number]  CC:  Chief Complaint  Patient presents with   Obesity   Depression   Sleep Apnea    HPI Patrick West presents for new patient visit to establish care.  Introduced to publishing rights manager role and practice setting.  All questions answered.  Discussed provider/patient relationship and expectations. Previous patient at clinic, but transitioned to [facility].  Last seen in this office 04/06/20.  Has primary care at the [facility].  No specialists there.  History of colon cancer in 2018 -- did not need to do chemo or radiation.  SLEEP APNEA Continues to use CPAP every night. Sleep apnea status: stable Duration: chronic Satisfied with current treatment?:  yes CPAP use:  yes Sleep quality with CPAP use: good, average Treament compliance:good compliance Last sleep study: years ago Treatments attempted: CPAP Wakes feeling refreshed:  yes Daytime hypersomnolence:  no Fatigue:  no Insomnia:  no Good sleep hygiene:  yes Difficulty falling asleep:  no Difficulty staying asleep:  no Snoring bothers bed partner:  no Observed apnea by bed partner: no Obesity:  yes Hypertension: no  Pulmonary hypertension:  no Coronary artery disease:  no  Impaired Fasting Glucose Wife is concerned that he may have diabetes. HbA1C:  Lab Results  Component Value Date   HGBA1C 5.7 04/18/2019  Duration of elevated blood sugar: years Polydipsia: yes Polyuria: yes Weight change: no Visual disturbance: no Glucose Monitoring: no    Accucheck frequency: Not Checking    Fasting glucose:     Post prandial:  Diabetic Education: Not Completed Family history of diabetes: yes   HYPERTENSION / HYPERLIPIDEMIA Stopped taking Atorvastatin  due to numb feeling in legs.  Has not taken in over one month. Continues to take HCTZ.  Has had some bruising to legs both sides that has been present for > 1  year, just has not said anything about it.  Skin does get dry and scaly, at times itches.  Lotion helps it go away. Satisfied with current treatment? yes Duration of hypertension: chronic BP monitoring frequency: not checking BP range:  BP medication side effects: no Duration of hyperlipidemia: chronic Cholesterol medication side effects: no Cholesterol supplements: none Medication compliance: good compliance Aspirin: no Recent stressors: no Recurrent headaches: no Visual changes: no Palpitations: no Dyspnea: no Chest pain: no Lower extremity edema: no Dizzy/lightheaded: no  The 10-year ASCVD risk score (Arnett DK, et al., 2019) is: 14.1%   DEPRESSION Currently takes Zoloft  on occasion.  Was on Ativan  in past, stopped years ago. Mood status: stable Satisfied with current treatment?: yes Symptom severity: mild  Duration of current treatment : chronic Side effects: no Medication compliance: fair compliance Psychotherapy/counseling: yes in the past Depressed mood: no Anxious mood: no Anhedonia: no Significant weight loss or gain: no Insomnia: none Fatigue: no Feelings of worthlessness or guilt: no Impaired concentration/indecisiveness: no Suicidal ideations: no Hopelessness: no Crying spells: no    09/23/2023    2:10 PM 04/06/2020    9:08 AM 12/13/2019    9:54 AM 08/30/2019    4:22 PM 04/18/2019    4:49 PM  Depression screen PHQ 2/9  Decreased Interest 0 0 1 2 0  Down, Depressed, Hopeless 1 0 1 2 1   PHQ - 2 Score 1 0 2 4 1   Altered sleeping 0 0 2 2 1   Tired, decreased energy 1 0 2 3  2  Change in appetite 2 0 1 3 1   Feeling bad or failure about yourself  0 0 1 3 0  Trouble concentrating 0 0 0 2 1  Moving slowly or fidgety/restless 0 0 0 3 0  Suicidal thoughts 0 0 0 0 0  PHQ-9 Score 4 0 8 20 6   Difficult doing work/chores Not difficult at all Not difficult at all Somewhat difficult Somewhat difficult Somewhat difficult       09/23/2023    2:10 PM 12/13/2019    9:55  AM 08/30/2019    4:20 PM 04/18/2019    4:49 PM  GAD 7 : Generalized Anxiety Score  Nervous, Anxious, on Edge 0 0 3 1  Control/stop worrying 0 1 3 1   Worry too much - different things 0 0 3 1  Trouble relaxing 0 0 2 1  Restless 0 1 1 1   Easily annoyed or irritable 1 0 1 2  Afraid - awful might happen 0 0 1 1  Total GAD 7 Score 1 2 14 8   Anxiety Difficulty Not difficult at all Not difficult at all Somewhat difficult Not difficult at all      Outpatient Encounter Medications as of 09/23/2023  Medication Sig   fluticasone (FLONASE) 50 MCG/ACT nasal spray Place into the nose.   hydrochlorothiazide (HYDRODIURIL) 25 MG tablet Take 25 mg by mouth daily.   loperamide (IMODIUM) 2 MG capsule Take 2 mg by mouth as needed.   rosuvastatin  (CRESTOR ) 10 MG tablet Take 1 tablet (10 mg total) by mouth daily.   sertraline  (ZOLOFT ) 100 MG tablet Take 50 mg by mouth daily.   sildenafil (VIAGRA) 100 MG tablet Take 100 mg by mouth as needed.   triamcinolone  cream (KENALOG ) 0.1 % Apply 1 Application topically 2 (two) times daily. To both legs.  Stop in 14 days.  May restart as needed, but only for periods of 14 days.   [DISCONTINUED] atorvastatin  (LIPITOR) 80 MG tablet Take 80 mg by mouth daily.   [DISCONTINUED] LORazepam  (ATIVAN ) 0.5 MG tablet Take one tablet (0.5 MG) every other day as needed for anxiety and continue to attempt to cut back on use of this.   [DISCONTINUED] Armodafinil  200 MG TABS TAKE 200MG  BY MOUTH DAILY   [DISCONTINUED] atorvastatin  (LIPITOR) 40 MG tablet Take 1 tablet (40 mg total) by mouth daily.   [DISCONTINUED] sertraline  (ZOLOFT ) 100 MG tablet Take 1 tablet (100 mg total) by mouth daily.   No facility-administered encounter medications on file as of 09/23/2023.    Past Medical History:  Diagnosis Date   Colon cancer (HCC) 2013   adenocarcinoma   ED (erectile dysfunction)    Hyperlipidemia    Sleep apnea    getting scheduled for sleep study   Wears dentures    partial upper     Past Surgical History:  Procedure Laterality Date   ABDOMINAL HERNIA REPAIR     COLONOSCOPY WITH PROPOFOL  N/A 07/07/2016   Procedure: COLONOSCOPY WITH PROPOFOL ;  Surgeon: Rogelia Copping, MD;  Location[facility];  Service: Endoscopy;  Laterality: N/A;  sleep apnea   PARTIAL COLECTOMY  02/2012    Family History  Problem Relation Age of Onset   Heart disease Mother    Hypertension Mother    Heart disease Father    Hypertension Father    Stroke Father    Glaucoma Father    Breast cancer Sister 37   Breast cancer Maternal Aunt        dx in  her late 20s-30s   Colon cancer Maternal Uncle        smoker   Breast cancer Paternal Aunt        dx in her 35s   Throat cancer Paternal Uncle        smoker   Breast cancer Maternal Aunt        dx at age 45   Colon cancer Maternal Aunt 83   Breast cancer Paternal Aunt        dx around age 57   Colon cancer Cousin        dx around age 45-17; paternal cousin   Colon cancer Cousin 93       paternal cousin    Social History   Socioeconomic History   Marital status: Married    Spouse name: Not on file   Number of children: Not on file   Years of education: Not on file   Highest education level: Not on file  Occupational History   Not on file  Tobacco Use   Smoking status: Never   Smokeless tobacco: Never  Vaping Use   Vaping status: Never Used  Substance and Sexual Activity   Alcohol use: Yes    Alcohol/week: 1.0 standard drink of alcohol    Types: 1 Glasses of wine per week    Comment: on occasion   Drug use: No   Sexual activity: Yes  Other Topics Concern   Not on file  Social History Narrative   Not on file   Social Drivers of Health   Financial Resource Strain: Low Risk  (09/23/2023)   Overall Financial Resource Strain (CARDIA)    Difficulty of Paying Living Expenses: Not hard at all  Food Insecurity: No Food Insecurity (09/23/2023)   Hunger Vital Sign    Worried About Running Out of Food in the Last Year:  Never true    Ran Out of Food in the Last Year: Never true  Transportation Needs: No Transportation Needs (09/23/2023)   PRAPARE - Administrator, Civil Service (Medical): No    Lack of Transportation (Non-Medical): No  Physical Activity: Insufficiently Active (09/23/2023)   Exercise Vital Sign    Days of Exercise per Week: 3 days    Minutes of Exercise per Session: 30 min  Stress: No Stress Concern Present (09/23/2023)   [employer] of Occupational Health - Occupational Stress Questionnaire    Feeling of Stress : Not at all  Social Connections: Moderately Integrated (09/23/2023)   Social Connection and Isolation Panel [NHANES]    Frequency of Communication with Friends and Family: More than three times a week    Frequency of Social Gatherings with Friends and Family: More than three times a week    Attends Religious Services: 1 to 4 times per year    Active Member of [employer] or Organizations: No    Attends Banker Meetings: Never    Marital Status: Married  Catering Manager Violence: Not At Risk (09/23/2023)   Humiliation, Afraid, Rape, and Kick questionnaire    Fear of Current or Ex-Partner: No    Emotionally Abused: No    Physically Abused: No    Sexually Abused: No    Review of Systems  Constitutional:  Negative for chills, diaphoresis, fever and weight loss.  Respiratory:  Negative for cough, shortness of breath and wheezing.   Cardiovascular:  Positive for leg swelling. Negative for chest pain, palpitations, orthopnea and PND.  Gastrointestinal: Negative.   Neurological: Negative.  Endo/Heme/Allergies: Negative.   Psychiatric/Behavioral: Negative.         Objective    BP 136/79   Pulse 94   Temp 98 F (36.7 C) (Oral)   Ht 5' 9.8 (1.773 m)   Wt (!) 313 lb 6.4 oz (142.2 kg)   SpO2 96%   BMI 45.23 kg/m   Physical Exam Vitals and nursing note reviewed.  Constitutional:      General: He is awake. He is not in acute distress.    Appearance:  He is well-developed and well-groomed. He is obese. He is not ill-appearing or toxic-appearing.  HENT:     Head: Normocephalic.     Right Ear: Hearing and external ear normal.     Left Ear: Hearing and external ear normal.  Eyes:     General: Lids are normal.     Extraocular Movements: Extraocular movements intact.     Conjunctiva/sclera: Conjunctivae normal.  Neck:     Thyroid: No thyromegaly.     Vascular: No carotid bruit.  Cardiovascular:     Rate and Rhythm: Normal rate and regular rhythm.     Heart sounds: Normal heart sounds. No murmur heard.    No gallop.     Comments: Bilateral legs with reduced hair pattern and dry skin, especially to ankle area.  Some hemosiderin staining present. No open wounds. Pulmonary:     Effort: No accessory muscle usage or respiratory distress.     Breath sounds: Normal breath sounds.  Abdominal:     General: Bowel sounds are normal. There is no distension.     Palpations: Abdomen is soft.     Tenderness: There is no abdominal tenderness.  Musculoskeletal:     Cervical back: Full passive range of motion without pain.     Right lower leg: 1+ Pitting Edema present.     Left lower leg: 1+ Pitting Edema present.  Lymphadenopathy:     Cervical: No cervical adenopathy.  Skin:    General: Skin is warm.     Capillary Refill: Capillary refill takes less than 2 seconds.  Neurological:     Mental Status: He is alert and oriented to person, place, and time.     Deep Tendon Reflexes: Reflexes are normal and symmetric.     Reflex Scores:      Brachioradialis reflexes are 2+ on the right side and 2+ on the left side.      Patellar reflexes are 2+ on the right side and 2+ on the left side. Psychiatric:        Attention and Perception: Attention normal.        Mood and Affect: Mood normal.        Speech: Speech normal.        Behavior: Behavior normal. Behavior is cooperative.        Thought Content: Thought content normal.     Last CBC Lab Results   Component Value Date   WBC 5.8 04/18/2019   HGB 15.1 04/18/2019   HCT 46.7 04/18/2019   MCV 87 04/18/2019   MCH 28.0 04/18/2019   RDW 14.6 04/18/2019   PLT 272 04/18/2019   Last metabolic panel Lab Results  Component Value Date   GLUCOSE 99 04/06/2020   NA 140 04/06/2020   K 4.3 04/06/2020   CL 101 04/06/2020   CO2 25 04/06/2020   BUN 8 04/06/2020   CREATININE 0.96 04/06/2020   GFRNONAA 89 04/06/2020   CALCIUM  9.5 04/06/2020   PROT 7.9 08/30/2019  ALBUMIN 4.4 08/30/2019   LABGLOB 3.5 08/30/2019   AGRATIO 1.3 08/30/2019   BILITOT 0.4 08/30/2019   ALKPHOS 58 08/30/2019   AST 36 08/30/2019   ALT 50 (H) 08/30/2019   ANIONGAP 6 (L) 03/11/2014   Last lipids Lab Results  Component Value Date   CHOL 271 (H) 04/06/2020   HDL 49 04/06/2020   LDLCALC 200 (H) 04/06/2020   TRIG 124 04/06/2020   Last hemoglobin A1c Lab Results  Component Value Date   HGBA1C 5.7 04/18/2019   Last thyroid functions Lab Results  Component Value Date   TSH 1.800 05/27/2019        Assessment & Plan:   Problem List Items Addressed This Visit       Respiratory   Sleep apnea   Chronic, consistently using CPAP.  Recommend continued use.      Relevant Orders   CBC with Differential/Platelet   TSH     Endocrine   IFG (impaired fasting glucose)   Check A1c today.  If elevations consider initiation of medication.      Relevant Orders   HgB A1c     Other   Depression, major, single episode, severe (HCC) - Primary   Chronic, stable.  Minimally takes Zoloft  100 MG, off Ativan  for some time now.  He denies SI/HI.        Relevant Medications   sertraline  (ZOLOFT ) 100 MG tablet   Hypercholesteremia   Chronic, ongoing.  Did not tolerate Atorvastatin , will trial Rosuvastatin  10 MG daily.  Discussed with him if side effects then try 3 days or 1 day a week of taking.  Check labs today.      Relevant Medications   rosuvastatin  (CRESTOR ) 10 MG tablet   Other Relevant Orders    Comprehensive metabolic panel   Lipid Panel w/o Chol/HDL Ratio   Personal history of colon cancer   Referral for repeat colonoscopy.      Relevant Orders   Ambulatory referral to Gastroenterology   Severe obesity (BMI >= 40) (HCC)   BMI 45.23. Recommended eating smaller high protein, low fat meals more frequently and exercising 30 mins a day 5 times a week with a goal of 10-15lb weight loss in the next 3 months. Patient voiced their understanding and motivation to adhere to these recommendations.       Other Visit Diagnoses       Benign prostatic hyperplasia without lower urinary tract symptoms       PSA on labs today   Relevant Orders   PSA     Need for hepatitis C screening test       Hep C check on labs today per guidelines, discussed with patient.   Relevant Orders   Hepatitis C antibody     Colon cancer screening       Referral to GI placed.   Relevant Orders   Ambulatory referral to Gastroenterology       Return in about 6 months (around 03/22/2024) for HTN/HLD.   Bodhi Stenglein T Bettie Swavely, NP

## 2023-09-23 NOTE — Assessment & Plan Note (Signed)
 Chronic, ongoing.  Did not tolerate Atorvastatin , will trial Rosuvastatin  10 MG daily.  Discussed with him if side effects then try 3 days or 1 day a week of taking.  Check labs today.

## 2023-09-23 NOTE — Assessment & Plan Note (Signed)
 Chronic, consistently using CPAP.  Recommend continued use.

## 2023-09-23 NOTE — Assessment & Plan Note (Signed)
Referral for repeat colonoscopy

## 2023-09-23 NOTE — Assessment & Plan Note (Signed)
 Check A1c today.  If elevations consider initiation of medication.

## 2023-09-23 NOTE — Assessment & Plan Note (Signed)
 Chronic, stable.  Minimally takes Zoloft  100 MG, off Ativan  for some time now.  He denies SI/HI.

## 2023-09-24 ENCOUNTER — Encounter: Payer: Self-pay | Admitting: Nurse Practitioner

## 2023-09-24 ENCOUNTER — Telehealth: Payer: Self-pay

## 2023-09-24 ENCOUNTER — Other Ambulatory Visit: Payer: Self-pay

## 2023-09-24 DIAGNOSIS — Z85038 Personal history of other malignant neoplasm of large intestine: Secondary | ICD-10-CM

## 2023-09-24 LAB — COMPREHENSIVE METABOLIC PANEL
ALT: 43 [IU]/L (ref 0–44)
AST: 23 [IU]/L (ref 0–40)
Albumin: 4.1 g/dL (ref 3.8–4.9)
Alkaline Phosphatase: 60 [IU]/L (ref 44–121)
BUN/Creatinine Ratio: 15 (ref 9–20)
BUN: 13 mg/dL (ref 6–24)
Bilirubin Total: 0.2 mg/dL (ref 0.0–1.2)
CO2: 23 mmol/L (ref 20–29)
Calcium: 9.6 mg/dL (ref 8.7–10.2)
Chloride: 102 mmol/L (ref 96–106)
Creatinine, Ser: 0.89 mg/dL (ref 0.76–1.27)
Globulin, Total: 3.2 g/dL (ref 1.5–4.5)
Glucose: 83 mg/dL (ref 70–99)
Potassium: 4.4 mmol/L (ref 3.5–5.2)
Sodium: 140 mmol/L (ref 134–144)
Total Protein: 7.3 g/dL (ref 6.0–8.5)
eGFR: 100 mL/min/{1.73_m2} (ref >59)

## 2023-09-24 LAB — CBC WITH DIFFERENTIAL/PLATELET
Basophils Absolute: 0 10*3/uL (ref 0.0–0.2)
Basos: 0 %
EOS (ABSOLUTE): 0.1 10*3/uL (ref 0.0–0.4)
Eos: 2 %
Hematocrit: 46 % (ref 37.5–51.0)
Hemoglobin: 15.1 g/dL (ref 13.0–17.7)
Immature Grans (Abs): 0 10*3/uL (ref 0.0–0.1)
Immature Granulocytes: 0 %
Lymphocytes Absolute: 1.4 10*3/uL (ref 0.7–3.1)
Lymphs: 21 %
MCH: 29 pg (ref 26.6–33.0)
MCHC: 32.8 g/dL (ref 31.5–35.7)
MCV: 88 fL (ref 79–97)
Monocytes Absolute: 0.7 10*3/uL (ref 0.1–0.9)
Monocytes: 11 %
Neutrophils Absolute: 4.4 10*3/uL (ref 1.4–7.0)
Neutrophils: 66 %
Platelets: 266 10*3/uL (ref 150–450)
RBC: 5.21 x10E6/uL (ref 4.14–5.80)
RDW: 13.7 % (ref 11.6–15.4)
WBC: 6.6 10*3/uL (ref 3.4–10.8)

## 2023-09-24 LAB — TSH: TSH: 1.61 u[IU]/mL (ref 0.450–4.500)

## 2023-09-24 LAB — LIPID PANEL W/O CHOL/HDL RATIO
Cholesterol, Total: 279 mg/dL — ABNORMAL HIGH (ref 100–199)
HDL: 48 mg/dL (ref >39)
LDL Chol Calc (NIH): 184 mg/dL — ABNORMAL HIGH (ref 0–99)
Triglycerides: 243 mg/dL — ABNORMAL HIGH (ref 0–149)
VLDL Cholesterol Cal: 47 mg/dL — ABNORMAL HIGH (ref 5–40)

## 2023-09-24 LAB — PSA: Prostate Specific Ag, Serum: 0.6 ng/mL (ref 0.0–4.0)

## 2023-09-24 LAB — HEPATITIS C ANTIBODY: Hep C Virus Ab: NONREACTIVE

## 2023-09-24 LAB — HEMOGLOBIN A1C
Est. average glucose Bld gHb Est-mCnc: 134 mg/dL
Hgb A1c MFr Bld: 6.3 % — ABNORMAL HIGH (ref 4.8–5.6)

## 2023-09-24 MED ORDER — NA SULFATE-K SULFATE-MG SULF 17.5-3.13-1.6 GM/177ML PO SOLN: 1.0000 | Freq: Once | ORAL | 0 refills | Status: AC

## 2023-09-24 NOTE — Progress Notes (Signed)
 Contacted via MyChart   Good evening Patrick West, your labs have returned: - Lipid panel shows some elevations, are you taking your Rosuvastatin  daily? Please let me know ASAP as if you are I would like to increase this to 20 MG and see if we can get better control. - Kidney function, creatinine and eGFR, remains normal, as is liver function, AST and ALT.  - CBC shows no anemia or infection. - Thyroid and prostate labs are normal. - The A1x is the diabetes testing we talked about, this looks at your blood sugars over the past 3 months and turns into an average number.  Your number is 6.3%, meaning you are prediabetic.  Any number 5.7 to 6.4 is considered prediabetes and any number 6.5 or greater is considered diabetes.   I would recommend heavy focus on decreasing foods high in sugar and your intake of things like bread products, pasta, and rice.  The American Diabetes Association online has a large amount of information on diet changes to make.  We will recheck this number in 3 months to ensure you are not continuing to trend upwards and move into diabetes.  Any questions? Keep being stellar!!  Thank you for allowing me to participate in your care.  I appreciate you. Kindest regards, Lora Glomski

## 2023-09-24 NOTE — Telephone Encounter (Signed)
 Gastroenterology Pre-Procedure Review  Request Date: 10/12/23 Requesting Physician: Dr. Jinny  PATIENT REVIEW QUESTIONS: The patient responded to the following health history questions as indicated:    1. Are you having any GI issues? no 2. Do you have a personal history of Polyps? yes (personal history of colon cancer 2013) 3. Do you have a family history of Colon Cancer or Polyps? yes (cousins had colon cancer) 4. Diabetes Mellitus? no 5. Joint replacements in the past 12 months?no 6. Major health problems in the past 3 months?no 7. Any artificial heart valves, MVP, or defibrillator?no    MEDICATIONS & ALLERGIES:    Patient reports the following regarding taking any anticoagulation/antiplatelet therapy:   Plavix, Coumadin, Eliquis, Xarelto, Lovenox, Pradaxa, Brilinta, or Effient? no Aspirin? no  Patient confirms/reports the following medications:  Current Outpatient Medications  Medication Sig Dispense Refill   fluticasone (FLONASE) 50 MCG/ACT nasal spray Place into the nose.     hydrochlorothiazide (HYDRODIURIL) 25 MG tablet Take 25 mg by mouth daily.     loperamide (IMODIUM) 2 MG capsule Take 2 mg by mouth as needed.     rosuvastatin  (CRESTOR ) 10 MG tablet Take 1 tablet (10 mg total) by mouth daily. 90 tablet 3   sertraline  (ZOLOFT ) 100 MG tablet Take 50 mg by mouth daily.     sildenafil (VIAGRA) 100 MG tablet Take 100 mg by mouth as needed.     triamcinolone  cream (KENALOG ) 0.1 % Apply 1 Application topically 2 (two) times daily. To both legs.  Stop in 14 days.  May restart as needed, but only for periods of 14 days. 30 g 1   No current facility-administered medications for this visit.    Patient confirms/reports the following allergies:  No Known Allergies  No orders of the defined types were placed in this encounter.   AUTHORIZATION INFORMATION Primary Insurance: 1D#: Group #:  Secondary Insurance: 1D#: Group #:  SCHEDULE INFORMATION: Date:  10/12/23 Time: Location: [facility]

## 2023-10-05 ENCOUNTER — Encounter: Payer: Self-pay | Admitting: Gastroenterology

## 2023-10-09 ENCOUNTER — Encounter: Payer: Self-pay | Admitting: Gastroenterology

## 2023-10-12 ENCOUNTER — Other Ambulatory Visit: Payer: Self-pay | Admitting: Gastroenterology

## 2023-10-12 ENCOUNTER — Ambulatory Visit: Payer: BC Managed Care – PPO | Admitting: Anesthesiology

## 2023-10-12 ENCOUNTER — Encounter
Admission: RE | Disposition: A | Discharge status: Home / Self Care | Payer: Self-pay | Source: Home / Self Care | Attending: Gastroenterology

## 2023-10-12 ENCOUNTER — Ambulatory Visit
Admission: RE | Admit: 2023-10-12 | Discharge: 2023-10-12 | Disposition: A | Discharge status: Home / Self Care | Payer: BC Managed Care – PPO | Attending: Gastroenterology | Admitting: Gastroenterology

## 2023-10-12 ENCOUNTER — Encounter: Payer: Self-pay | Admitting: Gastroenterology

## 2023-10-12 DIAGNOSIS — G4730 Sleep apnea, unspecified: Secondary | ICD-10-CM | POA: Insufficient documentation

## 2023-10-12 DIAGNOSIS — Z85038 Personal history of other malignant neoplasm of large intestine: Secondary | ICD-10-CM | POA: Insufficient documentation

## 2023-10-12 DIAGNOSIS — Z800 Family history of malignant neoplasm of digestive organs: Secondary | ICD-10-CM | POA: Diagnosis not present

## 2023-10-12 DIAGNOSIS — G709 Myoneural disorder, unspecified: Secondary | ICD-10-CM | POA: Diagnosis not present

## 2023-10-12 DIAGNOSIS — Z1211 Encounter for screening for malignant neoplasm of colon: Secondary | ICD-10-CM

## 2023-10-12 DIAGNOSIS — Z980 Intestinal bypass and anastomosis status: Secondary | ICD-10-CM | POA: Insufficient documentation

## 2023-10-12 DIAGNOSIS — F32A Depression, unspecified: Secondary | ICD-10-CM | POA: Diagnosis not present

## 2023-10-12 DIAGNOSIS — K635 Polyp of colon: Secondary | ICD-10-CM

## 2023-10-12 DIAGNOSIS — I10 Essential (primary) hypertension: Secondary | ICD-10-CM | POA: Diagnosis not present

## 2023-10-12 HISTORY — DX: Essential (primary) hypertension: I10

## 2023-10-12 HISTORY — PX: COLONOSCOPY WITH PROPOFOL: SHX5780

## 2023-10-12 HISTORY — PX: POLYPECTOMY: SHX5525

## 2023-10-12 SURGERY — COLONOSCOPY WITH PROPOFOL
Anesthesia: General

## 2023-10-12 MED ORDER — SODIUM CHLORIDE 0.9 % IV SOLN: INTRAVENOUS | Status: DC

## 2023-10-12 MED ORDER — PROPOFOL 500 MG/50ML IV EMUL
INTRAVENOUS | Status: DC | PRN
  Administered 2023-10-12: 50 ug/kg/min via INTRAVENOUS

## 2023-10-12 MED ORDER — LIDOCAINE HCL (CARDIAC) PF 100 MG/5ML IV SOSY
PREFILLED_SYRINGE | INTRAVENOUS | Status: DC | PRN
  Administered 2023-10-12: 50 mg via INTRAVENOUS

## 2023-10-12 MED ORDER — DEXMEDETOMIDINE HCL IN NACL 80 MCG/20ML IV SOLN
INTRAVENOUS | Status: AC
  Filled 2023-10-12: qty 20

## 2023-10-12 MED ORDER — LIDOCAINE HCL (PF) 2 % IJ SOLN
INTRAMUSCULAR | Status: AC
  Filled 2023-10-12: qty 10

## 2023-10-12 MED ORDER — SODIUM CHLORIDE 0.9% FLUSH: 3.0000 mL | INTRAVENOUS | Status: DC | PRN

## 2023-10-12 MED ORDER — PROPOFOL 10 MG/ML IV BOLUS
INTRAVENOUS | Status: DC | PRN
  Administered 2023-10-12: 30 mg via INTRAVENOUS
  Administered 2023-10-12: 50 mg via INTRAVENOUS

## 2023-10-12 MED ORDER — PROPOFOL 1000 MG/100ML IV EMUL
INTRAVENOUS | Status: AC
  Filled 2023-10-12: qty 100

## 2023-10-12 MED ORDER — SODIUM CHLORIDE 0.9% FLUSH: 3.0000 mL | Freq: Two times a day (BID) | INTRAVENOUS | Status: DC

## 2023-10-12 NOTE — Anesthesia Preprocedure Evaluation (Signed)
 Anesthesia Evaluation  Patient identified by MRN, date of birth, ID band Patient awake    Reviewed: Allergy & Precautions, H&P , NPO status , Patient's Chart, lab work & pertinent test results, reviewed documented beta blocker date and time   Airway Mallampati: III   Neck ROM: full    Dental  (+) Poor Dentition   Pulmonary sleep apnea    Pulmonary exam normal        Cardiovascular negative cardio ROS Normal cardiovascular exam Rhythm:regular Rate:Normal     Neuro/Psych  PSYCHIATRIC DISORDERS  Depression     Neuromuscular disease    GI/Hepatic negative GI ROS, Neg liver ROS,,,  Endo/Other  negative endocrine ROS    Renal/GU negative Renal ROS  negative genitourinary   Musculoskeletal   Abdominal   Peds  Hematology negative hematology ROS (+)   Anesthesia Other Findings Past Medical History: 2013: Colon cancer (HCC)     Comment:  adenocarcinoma No date: ED (erectile dysfunction) No date: Hyperlipidemia No date: Sleep apnea     Comment:  getting scheduled for sleep study No date: Wears dentures     Comment:  partial upper Past Surgical History: No date: ABDOMINAL HERNIA REPAIR 07/07/2016: COLONOSCOPY WITH PROPOFOL; N/A     Comment:  Procedure: COLONOSCOPY WITH PROPOFOL;  Surgeon: Midge Minium, MD;  Location: [facility];  Service:               Endoscopy;  Laterality: N/A;  sleep apnea 02/2012: PARTIAL COLECTOMY   Reproductive/Obstetrics negative OB ROS                             Anesthesia Physical Anesthesia Plan  ASA: 3  Anesthesia Plan: General   Post-op Pain Management:    Induction:   PONV Risk Score and Plan:   Airway Management Planned:   Additional Equipment:   Intra-op Plan:   Post-operative Plan:   Informed Consent: I have reviewed the patients History and Physical, chart, labs and discussed the procedure including the risks, benefits  and alternatives for the proposed anesthesia with the patient or authorized representative who has indicated his/her understanding and acceptance.     Dental Advisory Given  Plan Discussed with: CRNA  Anesthesia Plan Comments:        Anesthesia Quick Evaluation

## 2023-10-12 NOTE — Op Note (Signed)
 [facility] Gastroenterology Patient Name: Patrick West Procedure Date: 10/12/2023 7:59 AM MRN: [id number] Account #: [id number] Date of Birth: 10/13/1965 Admit Type: Outpatient Age: 58 Room: [facility] ENDO ROOM 4 Gender: Male Note Status: Finalized Instrument Name: Prentice Docker [id number] Procedure:             Colonoscopy Indications:           High risk colon cancer surveillance: Personal history                         of colon cancer Providers:             Midge Minium MD, MD Referring MD:          Dorie Rank. Cannady (Referring MD) Medicines:             Propofol per Anesthesia Complications:         No immediate complications. Procedure:             Pre-Anesthesia Assessment:                        - Prior to the procedure, a History and Physical was                         performed, and patient medications and allergies were                         reviewed. The patient's tolerance of previous                         anesthesia was also reviewed. The risks and benefits                         of the procedure and the sedation options and risks                         were discussed with the patient. All questions were                         answered, and informed consent was obtained. Prior                         Anticoagulants: The patient has taken no anticoagulant                         or antiplatelet agents. ASA Grade Assessment: II - A                         patient with mild systemic disease. After reviewing                         the risks and benefits, the patient was deemed in                         satisfactory condition to undergo the procedure.                        After obtaining informed consent, the colonoscope was  passed under direct vision. Throughout the procedure,                         the patient's blood pressure, pulse, and oxygen                         saturations were monitored continuously. The                          Colonoscope was introduced through the anus and                         advanced to the the cecum, identified by appendiceal                         orifice and ileocecal valve. The colonoscopy was                         performed without difficulty. The patient tolerated                         the procedure well. The quality of the bowel                         preparation was excellent. Findings:      The perianal and digital rectal examinations were normal.      Two sessile polyps were found in the descending colon. The polyps were 3       to 4 mm in size. These polyps were removed with a cold biopsy forceps.       Resection and retrieval were complete.      There was evidence of a prior side-to-side colo-colonic anastomosis in       the sigmoid colon. This was patent. Impression:            - Two 3 to 4 mm polyps in the descending colon,                         removed with a cold biopsy forceps. Resected and                         retrieved.                        - Patent side-to-side colo-colonic anastomosis. Recommendation:        - Discharge patient to home.                        - Resume previous diet.                        - Continue present medications.                        - Await pathology results.                        - Repeat colonoscopy in 5 years for surveillance. Procedure Code(s):     --- Professional ---  [address], Colonoscopy, flexible; with biopsy, single or                         multiple Diagnosis Code(s):     --- Professional ---                        Z85.038, Personal history of other malignant neoplasm                         of large intestine                        D12.4, Benign neoplasm of descending colon CPT copyright 2022 American Medical Association. All rights reserved. The codes documented in this report are preliminary and upon coder review may  be revised to meet current compliance requirements. Midge Minium MD, MD 10/12/2023 8:19:41 AM This report has been signed electronically. Number of Addenda: 0 Note Initiated On: 10/12/2023 7:59 AM Scope Withdrawal Time: 0 hours 8 minutes 51 seconds  Total Procedure Duration: 0 hours 11 minutes 27 seconds  Estimated Blood Loss:  Estimated blood loss: none.      [facility]

## 2023-10-12 NOTE — Anesthesia Postprocedure Evaluation (Signed)
 Anesthesia Post Note  Patient: Patrick West  Procedure(s) Performed: COLONOSCOPY WITH PROPOFOL POLYPECTOMY  Patient location during evaluation: PACU Anesthesia Type: General Level of consciousness: awake and alert Pain management: pain level controlled Vital Signs Assessment: post-procedure vital signs reviewed and stable Respiratory status: spontaneous breathing, nonlabored ventilation, respiratory function stable and patient connected to nasal cannula oxygen Cardiovascular status: blood pressure returned to baseline and stable Postop Assessment: no apparent nausea or vomiting Anesthetic complications: no   No notable events documented.   Last Vitals:  Vitals:   10/12/23 0738 10/12/23 0820  BP: (!) 149/90 131/88  Pulse: 66 74  Resp: 16 18  Temp: (!) 36.1 C (!) 36.4 C  SpO2: 97% 95%    Last Pain:  Vitals:   10/12/23 0820  TempSrc: Temporal  PainSc: 0-No pain                 Yevette Edwards

## 2023-10-12 NOTE — H&P (Signed)
 Midge Minium, MD  [facility] [address] [place], [place] [address] Phone:[phone] Fax : [phone]  Primary Care Physician:  Marjie Skiff, NP Primary Gastroenterologist:  Dr. Servando Snare  Pre-Procedure History & Physical: HPI:  Patrick West is a 58 y.o. male is here for an colonoscopy.   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 Authorizing Provider  rosuvastatin (CRESTOR) 10 MG tablet Take 1 tablet (10 mg total) by mouth daily. 09/23/23  Yes Cannady, Jolene T, NP  fluticasone (FLONASE) 50 MCG/ACT nasal spray Place into the nose. 11/11/22   [provider]  hydrochlorothiazide (HYDRODIURIL) 25 MG tablet Take 25 mg by mouth daily. 11/11/22   [provider]  loperamide (IMODIUM) 2 MG capsule Take 2 mg by mouth as needed. 09/08/22   [provider]  sertraline (ZOLOFT) 100 MG tablet Take 50 mg by mouth daily. 11/11/22   [provider]  sildenafil (VIAGRA) 100 MG tablet Take 100 mg by mouth as needed. 11/11/22   [provider]  triamcinolone cream (KENALOG) 0.1 % Apply 1 Application topically 2 (two) times daily. To both legs.  Stop in 14 days.  May restart as needed, but only for periods of 14 days. 09/23/23   Aura Dials T, NP    Allergies as of 09/24/2023   (No Known Allergies)    Family History  Problem Relation Age of Onset   Heart disease Mother     Hypertension Mother    Heart disease Father    Hypertension Father    Stroke Father    Glaucoma Father    Breast cancer Sister 49   Breast cancer Maternal Aunt        dx in her late 20s-30s   Colon cancer Maternal Uncle        smoker   Breast cancer Paternal Aunt        dx in her 36s   Throat cancer Paternal Uncle        smoker   Breast cancer Maternal Aunt        dx at age 58   Colon cancer Maternal Aunt 29   Breast cancer Paternal Aunt        dx around age 77   Colon cancer Cousin        dx around age 16-17; paternal cousin   Colon cancer Cousin 76       paternal cousin    Social History   Socioeconomic History   Marital status: Married    Spouse name: Not on file   Number of children: Not on file   Years of education: Not on file   Highest education level: Not on file  Occupational History   Not on file  Tobacco Use   Smoking status: Never   Smokeless tobacco: Never  Vaping Use  Vaping status: Never Used  Substance and Sexual Activity   Alcohol use: Yes    Alcohol/week: 1.0 standard drink of alcohol    Types: 1 Glasses of wine per week    Comment: on occasion   Drug use: No   Sexual activity: Yes  Other Topics Concern   Not on file  Social History Narrative   Not on file   Social Drivers of Health   Financial Resource Strain: Low Risk  (09/23/2023)   Overall Financial Resource Strain (CARDIA)    Difficulty of Paying Living Expenses: Not hard at all  Food Insecurity: No Food Insecurity (09/23/2023)   Hunger Vital Sign    Worried About Running Out of Food in the Last Year: Never true    Ran Out of Food in the Last Year: Never true  Transportation Needs: No Transportation Needs (09/23/2023)   PRAPARE - Administrator, Civil Service (Medical): No    Lack of Transportation (Non-Medical): No  Physical Activity: Insufficiently Active (09/23/2023)   Exercise Vital Sign    Days of Exercise per Week: 3 days    Minutes of Exercise per Session: 30 min   Stress: No Stress Concern Present (09/23/2023)   [employer] of Occupational Health - Occupational Stress Questionnaire    Feeling of Stress : Not at all  Social Connections: Moderately Integrated (09/23/2023)   Social Connection and Isolation Panel [NHANES]    Frequency of Communication with Friends and Family: More than three times a week    Frequency of Social Gatherings with Friends and Family: More than three times a week    Attends Religious Services: 1 to 4 times per year    Active Member of [employer] or Organizations: No    Attends Banker Meetings: Never    Marital Status: Married  Catering manager Violence: Not At Risk (09/23/2023)   Humiliation, Afraid, Rape, and Kick questionnaire    Fear of Current or Ex-Partner: No    Emotionally Abused: No    Physically Abused: No    Sexually Abused: No    Review of Systems: See HPI, otherwise negative ROS  Physical Exam: BP (!) 149/90   Pulse 66   Temp (!) 96.9 F (36.1 C) (Temporal)   Resp 16   Ht 5\' 9"  (1.753 m)   Wt (!) 142 kg   SpO2 97%   BMI 46.23 kg/m  General:   Alert,  pleasant and cooperative in NAD Head:  Normocephalic and atraumatic. Neck:  Supple; no masses or thyromegaly. Lungs:  Clear throughout to auscultation.    Heart:  Regular rate and rhythm. Abdomen:  Soft, nontender and nondistended. Normal bowel sounds, without guarding, and without rebound.   Neurologic:  Alert and  oriented x4;  grossly normal neurologically.  Impression/Plan: Patrick West is here for an colonoscopy to be performed for person history of colon cancer  Risks, benefits, limitations, and alternatives regarding  colonoscopy have been reviewed with the patient.  Questions have been answered.  All parties agreeable.   Midge Minium, MD  10/12/2023, 7:48 AM

## 2023-10-12 NOTE — Transfer of Care (Signed)
 Immediate Anesthesia Transfer of Care Note  Patient: Patrick West  Procedure(s) Performed: COLONOSCOPY WITH PROPOFOL POLYPECTOMY  Patient Location: PACU  Anesthesia Type:General  Level of Consciousness: awake, alert , and oriented  Airway & Oxygen Therapy: Patient Spontanous Breathing  Post-op Assessment: Report given to RN and Post -op Vital signs reviewed and stable  Post vital signs: Reviewed and stable  Last Vitals:  Vitals Value Taken Time  BP    Temp    Pulse 73 10/12/23 0821  Resp    SpO2 97 % 10/12/23 0821  Vitals shown include unfiled device data.  Last Pain:  Vitals:   10/12/23 0738  TempSrc: Temporal         Complications: No notable events documented.

## 2023-10-13 ENCOUNTER — Encounter: Payer: Self-pay | Admitting: Gastroenterology

## 2023-10-13 LAB — SURGICAL PATHOLOGY

## 2023-10-15 ENCOUNTER — Encounter: Payer: Self-pay | Admitting: Gastroenterology

## 2023-10-26 ENCOUNTER — Ambulatory Visit: Admitting: Nurse Practitioner

## 2023-10-26 ENCOUNTER — Encounter: Payer: Self-pay | Admitting: Nurse Practitioner

## 2023-10-26 VITALS — BP 136/76 | HR 73 | Temp 98.1°F | Ht 69.7 in | Wt 311.4 lb

## 2023-10-26 DIAGNOSIS — M25571 Pain in right ankle and joints of right foot: Secondary | ICD-10-CM | POA: Insufficient documentation

## 2023-10-26 MED ORDER — METHYLPREDNISOLONE 4 MG PO TBPK: ORAL_TABLET | ORAL | 0 refills | Status: DC

## 2023-10-26 NOTE — Progress Notes (Signed)
 BP 136/76 (BP Location: Left Arm, Patient Position: Sitting, Cuff Size: Large)   Pulse 73   Temp 98.1 F (36.7 C) (Oral)   Ht 5' 9.7" (1.77 m)   Wt (!) 311 lb 6.4 oz (141.3 kg)   SpO2 93%   BMI 45.07 kg/m    Subjective:    Patient ID: Patrick West, male    DOB: 1966/06/26, 58 y.o.   MRN: [id number]  HPI: Patrick West is a 58 y.o. male  Chief Complaint  Patient presents with   Ankle Pain    Patient states he woke up Saturday morning with R ankle pain. States it was hurting just a little bi Friday evening but got worse by Saturday morning. No known injuries per patient.    RIGHT ANKLE PAIN Felt a little pain on Friday night and then Saturday morning woke-up and pain was worse. No recent injuries.  No history of gout. Duration: days Involved ankle: right Mechanism of injury: unknown Location: right ankle, lateral Onset: sudden  Severity: 8/10  Quality:  dull, aching, and throbbing Frequency: constant Radiation: no Aggravating factors: weight bearing, walking, and movement , lifting foot up Alleviating factors: Epsom salt and Voltaren  Status: fluctuating Treatments attempted: Epsom salt and Voltaren, Tylenol  Relief with NSAIDs?:  No NSAIDs Taken Weakness with weight bearing or walking: a little bit Morning stiffness: yes Swelling: no Redness: no Bruising: no Paresthesias / decreased sensation: no  Fevers:no   Relevant past medical, surgical, family and social history reviewed and updated as indicated. Interim medical history since our last visit reviewed. Allergies and medications reviewed and updated.  Review of Systems  Constitutional:  Negative for activity change, diaphoresis, fatigue and fever.  Respiratory:  Negative for cough, chest tightness, shortness of breath and wheezing.   Cardiovascular:  Negative for chest pain, palpitations and leg swelling.  Gastrointestinal: Negative.   Musculoskeletal:  Positive for arthralgias.  Neurological: Negative.    Psychiatric/Behavioral: Negative.      Per HPI unless specifically indicated above     Objective:    BP 136/76 (BP Location: Left Arm, Patient Position: Sitting, Cuff Size: Large)   Pulse 73   Temp 98.1 F (36.7 C) (Oral)   Ht 5' 9.7" (1.77 m)   Wt (!) 311 lb 6.4 oz (141.3 kg)   SpO2 93%   BMI 45.07 kg/m   Wt Readings from Last 3 Encounters:  10/26/23 (!) 311 lb 6.4 oz (141.3 kg)  10/12/23 (!) 313 lb 0.9 oz (142 kg)  09/23/23 (!) 313 lb 6.4 oz (142.2 kg)    Physical Exam Vitals and nursing note reviewed.  Constitutional:      General: He is awake. He is not in acute distress.    Appearance: He is well-developed and well-groomed. He is obese. He is not ill-appearing or toxic-appearing.  HENT:     Head: Normocephalic.     Right Ear: Hearing and external ear normal.     Left Ear: Hearing and external ear normal.  Eyes:     General: Lids are normal.     Extraocular Movements: Extraocular movements intact.     Conjunctiva/sclera: Conjunctivae normal.  Neck:     Thyroid: No thyromegaly.     Vascular: No carotid bruit.  Cardiovascular:     Rate and Rhythm: Normal rate and regular rhythm.     Heart sounds: Normal heart sounds. No murmur heard.    No gallop.  Pulmonary:     Effort: No accessory muscle  usage or respiratory distress.     Breath sounds: Normal breath sounds.  Abdominal:     General: Bowel sounds are normal. There is no distension.     Palpations: Abdomen is soft.     Tenderness: There is no abdominal tenderness.  Musculoskeletal:     Cervical back: Full passive range of motion without pain.     Right lower leg: Edema (trace) present.     Left lower leg: Edema (trace) present.     Right ankle: Swelling (to lateral aspect) present. Tenderness present over the lateral malleolus. Decreased range of motion. Anterior drawer test negative. Normal pulse.     Right Achilles Tendon: No tenderness. Thompson's test negative.     Left ankle: Normal. No swelling. No  tenderness. Normal range of motion. Normal pulse.     Left Achilles Tendon: No tenderness. Thompson's test negative.     Comments: Right compared to left ankle with swelling to lateral aspect.  There is mild warmth and redness noted on exam to lateral right ankle.  Lymphadenopathy:     Cervical: No cervical adenopathy.  Skin:    General: Skin is warm.     Capillary Refill: Capillary refill takes less than 2 seconds.  Neurological:     Mental Status: He is alert and oriented to person, place, and time.     Deep Tendon Reflexes: Reflexes are normal and symmetric.     Reflex Scores:      Brachioradialis reflexes are 2+ on the right side and 2+ on the left side.      Patellar reflexes are 2+ on the right side and 2+ on the left side. Psychiatric:        Attention and Perception: Attention normal.        Mood and Affect: Mood normal.        Speech: Speech normal.        Behavior: Behavior normal. Behavior is cooperative.        Thought Content: Thought content normal.     Results for orders placed or performed in visit on 10/12/23  Surgical pathology   Collection Time: 10/12/23 12:00 AM  Result Value Ref Range   SURGICAL PATHOLOGY      SURGICAL PATHOLOGY [facility] [address] [place], [place] [address] Telephone [phone] or [phone] Fax [phone]  REPORT OF SURGICAL PATHOLOGY   Accession #: [id number] Patient Name: Patrick West, Patrick West Visit # :   MRN: [id number] Physician: Midge Minium DOB/Age 11/01/1965 (Age: 42) Gender: M Collected Date: 10/12/2023 Received Date: 10/12/2023  FINAL DIAGNOSIS       1. Descending Colon Polyp, x2 cbx :       - HYPERPLASTIC POLYP (2).      - NEGATIVE FOR DYSPLASIA AND MALIGNANCY.       DATE SIGNED OUT: 10/13/2023 ELECTRONIC SIGNATURE : Oneita Kras Md, Delice Bison , Pathologist, Electronic Signature  MICROSCOPIC DESCRIPTION  CASE COMMENTS STAINS USED IN DIAGNOSIS: H&E    CLINICAL  HISTORY  SPECIMEN(S) OBTAINED 1. Descending Colon Polyp, X2 Cbx  SPECIMEN COMMENTS: SPECIMEN CLINICAL INFORMATION: 1. Personal history of colon cancer, colon polyps    Gross Description 1. "Descending colon polyp CBX x2", received  in formalin is a 1.0 x 0.3 x 0.1 cm aggregate of 4 gray-tan tissue fragments. The specimen is submitted in toto in 1 block (1A).      [facility] 10/12/2023        Report signed out from the following location(s) [facility] [address] [place], [place] [address] CLIA #: [id number]  [facility] [address] [place], [place] [address] CLIA #: [id number]       Assessment & Plan:   Problem List Items Addressed This Visit       Other   Acute right ankle pain - Primary   For a few days, no improvement with OTC methods.  ?gout flare based on exam and HPI.  Will start steroid taper, educated him on this and it may increase sugars a little.  Discussed gout and diet, recommended foods he should avoid at this time.  Check uric acid level.  Continue Voltaren gel and Epsom salt at home.  Return in one week, if ongoing pain consider imaging and podiatry.      Relevant Orders   Uric acid     Follow up plan: Return in about 1 week (around 11/02/2023) for RIGHT ANKLE PAIN.

## 2023-10-26 NOTE — Patient Instructions (Signed)
 Low-Purine Eating Plan A low-purine eating plan involves making food choices to limit your purine intake. Purine is a kind of uric acid. Too much uric acid in your blood can cause certain conditions, such as gout and kidney stones. Eating a low-purine diet may help control these conditions. What are tips for following this plan? Shopping Avoid buying products that contain high-fructose corn syrup. Check for this on food labels. It is commonly found in many processed foods and soft drinks. Be sure to check for it in baked goods such as cookies, canned fruits, and cereals and cereal bars. Avoid buying veal, chicken breast with skin, lamb, and organ meats such as liver. These types of meats tend to have the highest purine content. Choose dairy products. These may lower uric acid levels. Avoid certain types of fish. Not all fish and seafood have high purine content. Examples with high purine content include anchovies, trout, tuna, sardines, and salmon. Avoid buying beverages that contain alcohol, particularly beer and hard liquor. Alcohol can affect the way your body gets rid of uric acid. Meal planning  Learn which foods do or do not affect you. If you find out that a food tends to cause your gout symptoms to flare up, avoid eating that food. You can enjoy foods that do not cause problems. If you have any questions about a food item, talk with your dietitian or health care provider. Reduce the overall amount of meat in your diet. When you do eat meat, choose ones with lower purine content. Include plenty of fruits and vegetables. Although some vegetables may have a high purine content--such as asparagus, mushrooms, spinach, or cauliflower--it has been shown that these do not contribute to uric acid blood levels as much. Consume at least 1 dairy serving a day. This has been shown to decrease uric acid levels. General information If you drink alcohol: Limit how much you have to: 0-1 drink a day for  women who are not pregnant. 0-2 drinks a day for men. Know how much alcohol is in a drink. In the [place] one drink equals one 12 oz bottle of beer (355 mL), one 5 oz glass of wine (148 mL), or one 1 oz glass of hard liquor (44 mL). Drink plenty of water. Try to drink enough to keep your urine pale yellow. Fluids can help remove uric acid from your body. Work with your health care provider and dietitian to develop a plan to achieve or maintain a healthy weight. Losing weight may help reduce uric acid in your blood. What foods are recommended? The following are some types of foods that are good choices when limiting purine intake: Fresh or frozen fruits and vegetables. Whole grains, breads, cereals, and pasta. Rice. Beans, peas, legumes. Nuts and seeds. Dairy products. Fats and oils. The items listed above may not be a complete list. Talk with a dietitian about what dietary choices are best for you. What foods are not recommended? Limit your intake of foods high in purines, including: Beer and other alcohol. Meat-based gravy or sauce. Canned or fresh fish, such as: Anchovies, sardines, herring, salmon, and tuna. Mussels and scallops. Codfish, trout, and haddock. Bacon, veal, chicken breast with skin, and lamb. Organ meats, such as: Liver or kidney. Tripe. Sweetbreads (thymus gland or pancreas). Wild Education officer, environmental. Yeast or yeast extract supplements. Drinks sweetened with high-fructose corn syrup, such as soda. Processed foods made with high-fructose corn syrup. The items listed above may not be a complete list of foods  and beverages you should limit. Contact a dietitian for more information. Summary Eating a low-purine diet may help control conditions caused by too much uric acid in the body, such as gout or kidney stones. Choose low-purine foods, limit alcohol, and limit high-fructose corn syrup. You will learn over time which foods do or do not affect you. If you find out that a  food tends to cause your gout symptoms to flare up, avoid eating that food. This information is not intended to replace advice given to you by your health care provider. Make sure you discuss any questions you have with your health care provider. Document Revised: 07/18/2021 Document Reviewed: 07/18/2021 Elsevier Patient Education  2024 [employer].

## 2023-10-26 NOTE — Assessment & Plan Note (Signed)
 For a few days, no improvement with OTC methods.  ?gout flare based on exam and HPI.  Will start steroid taper, educated him on this and it may increase sugars a little.  Discussed gout and diet, recommended foods he should avoid at this time.  Check uric acid level.  Continue Voltaren gel and Epsom salt at home.  Return in one week, if ongoing pain consider imaging and podiatry.

## 2023-10-27 ENCOUNTER — Encounter: Payer: Self-pay | Admitting: Nurse Practitioner

## 2023-10-27 LAB — URIC ACID: Uric Acid: 7.9 mg/dL (ref 3.8–8.4)

## 2023-10-27 NOTE — Progress Notes (Signed)
 Contacted via MyChart   Good morning Patrick West, your uric acid level returned and is a little bit elevated.  We like to see <6.  I do suspect some gout is present in that ankle.  Continue current medication regimen as ordered and we will see if this improves symptoms.  Any questions? Keep being amazing!!  Thank you for allowing me to participate in your care.  I appreciate you. Kindest regards, Sarabeth Benton

## 2023-11-01 NOTE — Patient Instructions (Signed)
 Low-Purine Eating Plan A low-purine eating plan involves making food choices to limit your purine intake. Purine is a kind of uric acid. Too much uric acid in your blood can cause certain conditions, such as gout and kidney stones. Eating a low-purine diet may help control these conditions. What are tips for following this plan? Shopping Avoid buying products that contain high-fructose corn syrup. Check for this on food labels. It is commonly found in many processed foods and soft drinks. Be sure to check for it in baked goods such as cookies, canned fruits, and cereals and cereal bars. Avoid buying veal, chicken breast with skin, lamb, and organ meats such as liver. These types of meats tend to have the highest purine content. Choose dairy products. These may lower uric acid levels. Avoid certain types of fish. Not all fish and seafood have high purine content. Examples with high purine content include anchovies, trout, tuna, sardines, and salmon. Avoid buying beverages that contain alcohol, particularly beer and hard liquor. Alcohol can affect the way your body gets rid of uric acid. Meal planning  Learn which foods do or do not affect you. If you find out that a food tends to cause your gout symptoms to flare up, avoid eating that food. You can enjoy foods that do not cause problems. If you have any questions about a food item, talk with your dietitian or health care provider. Reduce the overall amount of meat in your diet. When you do eat meat, choose ones with lower purine content. Include plenty of fruits and vegetables. Although some vegetables may have a high purine content--such as asparagus, mushrooms, spinach, or cauliflower--it has been shown that these do not contribute to uric acid blood levels as much. Consume at least 1 dairy serving a day. This has been shown to decrease uric acid levels. General information If you drink alcohol: Limit how much you have to: 0-1 drink a day for  women who are not pregnant. 0-2 drinks a day for men. Know how much alcohol is in a drink. In the [place] one drink equals one 12 oz bottle of beer (355 mL), one 5 oz glass of wine (148 mL), or one 1 oz glass of hard liquor (44 mL). Drink plenty of water. Try to drink enough to keep your urine pale yellow. Fluids can help remove uric acid from your body. Work with your health care provider and dietitian to develop a plan to achieve or maintain a healthy weight. Losing weight may help reduce uric acid in your blood. What foods are recommended? The following are some types of foods that are good choices when limiting purine intake: Fresh or frozen fruits and vegetables. Whole grains, breads, cereals, and pasta. Rice. Beans, peas, legumes. Nuts and seeds. Dairy products. Fats and oils. The items listed above may not be a complete list. Talk with a dietitian about what dietary choices are best for you. What foods are not recommended? Limit your intake of foods high in purines, including: Beer and other alcohol. Meat-based gravy or sauce. Canned or fresh fish, such as: Anchovies, sardines, herring, salmon, and tuna. Mussels and scallops. Codfish, trout, and haddock. Bacon, veal, chicken breast with skin, and lamb. Organ meats, such as: Liver or kidney. Tripe. Sweetbreads (thymus gland or pancreas). Wild Education officer, environmental. Yeast or yeast extract supplements. Drinks sweetened with high-fructose corn syrup, such as soda. Processed foods made with high-fructose corn syrup. The items listed above may not be a complete list of foods  and beverages you should limit. Contact a dietitian for more information. Summary Eating a low-purine diet may help control conditions caused by too much uric acid in the body, such as gout or kidney stones. Choose low-purine foods, limit alcohol, and limit high-fructose corn syrup. You will learn over time which foods do or do not affect you. If you find out that a  food tends to cause your gout symptoms to flare up, avoid eating that food. This information is not intended to replace advice given to you by your health care provider. Make sure you discuss any questions you have with your health care provider. Document Revised: 07/18/2021 Document Reviewed: 07/18/2021 Elsevier Patient Education  2024 [employer].

## 2023-11-04 ENCOUNTER — Ambulatory Visit: Admitting: Nurse Practitioner

## 2023-11-04 ENCOUNTER — Encounter: Payer: Self-pay | Admitting: Nurse Practitioner

## 2023-11-04 VITALS — BP 134/78 | HR 71 | Temp 98.0°F | Ht 69.7 in | Wt 305.0 lb

## 2023-11-04 DIAGNOSIS — M25571 Pain in right ankle and joints of right foot: Secondary | ICD-10-CM | POA: Diagnosis not present

## 2023-11-04 MED ORDER — COLCHICINE 0.6 MG PO TABS: ORAL_TABLET | ORAL | 1 refills | Status: AC

## 2023-11-04 NOTE — Assessment & Plan Note (Signed)
 Improving, but not 100%.  Suspect gout.  Will send in Colchicine to take as needed.  Discussed gout and diet, recommended foods he should avoid at this time.  Continue Voltaren gel and Epsom salt at home.  If ongoing or worsening to alert provider and will get into podiatry for further evaluation.

## 2023-11-04 NOTE — Progress Notes (Signed)
 BP 134/78   Pulse 71   Temp 98 F (36.7 C) (Oral)   Ht 5' 9.7" (1.77 m)   Wt (!) 305 lb (138.3 kg)   SpO2 98%   BMI 44.14 kg/m    Subjective:    Patient ID: Patrick West, male    DOB: Jun 08, 1966, 58 y.o.   MRN: [id number]  HPI: Patrick West is a 58 y.o. male  Chief Complaint  Patient presents with   Ankle Pain   RIGHT ANKLE PAIN Follow-up today for right ankle pain.  Was initially seen on 10/26/23.  Started steroid taper.  Uric acid level was 7.9.  He reports steroids did help. Reports he is almost 100% better, that a little pain came back on Friday. Duration: days Involved ankle: right Location: right ankle, lateral Onset: sudden  Severity: 2/10  Quality:  dull Frequency: intermittent Radiation: no Aggravating factors: weight bearing, walking, and movement  Alleviating factors: Prednisone and tart cherry juice  Status:  improving Treatments attempted: Prednisone and tart cherry juice  Relief with NSAIDs?:  No NSAIDs Taken Weakness with weight bearing or walking:  a little Morning stiffness: no Swelling: no Redness: no Bruising: no Paresthesias / decreased sensation: no  Fevers:no   Relevant past medical, surgical, family and social history reviewed and updated as indicated. Interim medical history since our last visit reviewed. Allergies and medications reviewed and updated.  Review of Systems  Constitutional:  Negative for activity change, diaphoresis, fatigue and fever.  Respiratory:  Negative for cough, chest tightness, shortness of breath and wheezing.   Cardiovascular:  Negative for chest pain, palpitations and leg swelling.  Gastrointestinal: Negative.   Musculoskeletal:  Positive for arthralgias.  Neurological: Negative.   Psychiatric/Behavioral: Negative.      Per HPI unless specifically indicated above     Objective:    BP 134/78   Pulse 71   Temp 98 F (36.7 C) (Oral)   Ht 5' 9.7" (1.77 m)   Wt (!) 305 lb (138.3 kg)   SpO2 98%    BMI 44.14 kg/m   Wt Readings from Last 3 Encounters:  11/04/23 (!) 305 lb (138.3 kg)  10/26/23 (!) 311 lb 6.4 oz (141.3 kg)  10/12/23 (!) 313 lb 0.9 oz (142 kg)    Physical Exam Vitals and nursing note reviewed.  Constitutional:      General: He is awake. He is not in acute distress.    Appearance: He is well-developed and well-groomed. He is obese. He is not ill-appearing or toxic-appearing.  HENT:     Head: Normocephalic.     Right Ear: Hearing and external ear normal.     Left Ear: Hearing and external ear normal.  Eyes:     General: Lids are normal.     Extraocular Movements: Extraocular movements intact.     Conjunctiva/sclera: Conjunctivae normal.  Neck:     Thyroid: No thyromegaly.     Vascular: No carotid bruit.  Cardiovascular:     Rate and Rhythm: Normal rate and regular rhythm.     Heart sounds: Normal heart sounds. No murmur heard.    No gallop.  Pulmonary:     Effort: No accessory muscle usage or respiratory distress.     Breath sounds: Normal breath sounds.  Abdominal:     General: Bowel sounds are normal. There is no distension.     Palpations: Abdomen is soft.     Tenderness: There is no abdominal tenderness.  Musculoskeletal:  Cervical back: Full passive range of motion without pain.     Right lower leg: Edema (trace) present.     Left lower leg: Edema (trace) present.     Right ankle: Swelling (improving) present. Tenderness present over the lateral malleolus (improving). Normal range of motion. Anterior drawer test negative. Normal pulse.     Right Achilles Tendon: No tenderness. Thompson's test negative.     Left ankle: Normal. No swelling. No tenderness. Normal range of motion. Normal pulse.     Left Achilles Tendon: No tenderness. Thompson's test negative.     Comments: No warmth or redness to right ankle.  Tenderness slightly to lateral aspect, but improved from previous check. No antalgic gait today.  Lymphadenopathy:     Cervical: No cervical  adenopathy.  Skin:    General: Skin is warm.     Capillary Refill: Capillary refill takes less than 2 seconds.  Neurological:     Mental Status: He is alert and oriented to person, place, and time.     Deep Tendon Reflexes: Reflexes are normal and symmetric.     Reflex Scores:      Brachioradialis reflexes are 2+ on the right side and 2+ on the left side.      Patellar reflexes are 2+ on the right side and 2+ on the left side. Psychiatric:        Attention and Perception: Attention normal.        Mood and Affect: Mood normal.        Speech: Speech normal.        Behavior: Behavior normal. Behavior is cooperative.        Thought Content: Thought content normal.     Results for orders placed or performed in visit on 10/26/23  Uric acid   Collection Time: 10/26/23 10:53 AM  Result Value Ref Range   Uric Acid 7.9 3.8 - 8.4 mg/dL      Assessment & Plan:   Problem List Items Addressed This Visit       Other   Acute right ankle pain - Primary   Improving, but not 100%.  Suspect gout.  Will send in Colchicine to take as needed.  Discussed gout and diet, recommended foods he should avoid at this time.  Continue Voltaren gel and Epsom salt at home.  If ongoing or worsening to alert provider and will get into podiatry for further evaluation.        Follow up plan: Return if symptoms worsen or fail to improve.

## 2024-03-19 NOTE — Patient Instructions (Incomplete)

## 2024-03-23 ENCOUNTER — Ambulatory Visit: Payer: BC Managed Care – PPO | Admitting: Nurse Practitioner

## 2024-03-23 DIAGNOSIS — F322 Major depressive disorder, single episode, severe without psychotic features: Secondary | ICD-10-CM

## 2024-03-23 DIAGNOSIS — E7800 Pure hypercholesterolemia, unspecified: Secondary | ICD-10-CM

## 2024-03-23 DIAGNOSIS — I10 Essential (primary) hypertension: Secondary | ICD-10-CM

## 2024-03-23 DIAGNOSIS — R7309 Other abnormal glucose: Secondary | ICD-10-CM

## 2024-03-23 DIAGNOSIS — G4733 Obstructive sleep apnea (adult) (pediatric): Secondary | ICD-10-CM
